# Patient Record
Sex: Male | Born: 1975 | Race: White | Hispanic: No
Health system: Western US, Academic
[De-identification: ages and names within clinical notes are randomized; demographics above are authoritative.]

## PROBLEM LIST (undated history)

## (undated) DIAGNOSIS — G43109 Migraine with aura, not intractable, without status migrainosus: Secondary | ICD-10-CM

## (undated) DIAGNOSIS — G4733 Obstructive sleep apnea (adult) (pediatric): Secondary | ICD-10-CM

## (undated) DIAGNOSIS — Q204 Double inlet ventricle: Secondary | ICD-10-CM

## (undated) DIAGNOSIS — D689 Coagulation defect, unspecified: Secondary | ICD-10-CM

## (undated) DIAGNOSIS — I4892 Unspecified atrial flutter: Secondary | ICD-10-CM

## (undated) DIAGNOSIS — M109 Gout, unspecified: Secondary | ICD-10-CM

## (undated) DIAGNOSIS — N12 Tubulo-interstitial nephritis, not specified as acute or chronic: Secondary | ICD-10-CM

## (undated) DIAGNOSIS — I495 Sick sinus syndrome: Secondary | ICD-10-CM

## (undated) HISTORY — DX: Coagulation defect, unspecified: D68.9

## (undated) HISTORY — DX: Gout, unspecified: M10.9

## (undated) HISTORY — DX: Sick sinus syndrome: I49.5

## (undated) HISTORY — DX: Migraine with aura, not intractable, without status migrainosus: G43.109

## (undated) HISTORY — DX: Unspecified atrial flutter: I48.92

## (undated) HISTORY — DX: Tubulo-interstitial nephritis, not specified as acute or chronic: N12

## (undated) HISTORY — DX: Obstructive sleep apnea (adult) (pediatric): G47.33

## (undated) HISTORY — DX: Double inlet ventricle: Q20.4

---

## 1998-05-07 ENCOUNTER — Other Ambulatory Visit (HOSPITAL_BASED_OUTPATIENT_CLINIC_OR_DEPARTMENT_OTHER): Payer: Self-pay | Admitting: Orthopaedic Surgery

## 1998-05-23 LAB — PATHOLOGY, SURGICAL

## 2001-03-21 ENCOUNTER — Encounter: Payer: Self-pay | Admitting: Cardiovascular Disease

## 2001-04-18 ENCOUNTER — Encounter: Payer: Self-pay | Admitting: Cardiovascular Disease

## 2001-04-18 ENCOUNTER — Other Ambulatory Visit: Payer: Self-pay

## 2014-09-13 DIAGNOSIS — S329XXA Fracture of unspecified parts of lumbosacral spine and pelvis, initial encounter for closed fracture: Secondary | ICD-10-CM

## 2014-09-13 DIAGNOSIS — G43909 Migraine, unspecified, not intractable, without status migrainosus: Secondary | ICD-10-CM

## 2014-09-13 DIAGNOSIS — F419 Anxiety disorder, unspecified: Secondary | ICD-10-CM

## 2014-09-13 DIAGNOSIS — I272 Pulmonary hypertension, unspecified: Secondary | ICD-10-CM

## 2014-09-13 DIAGNOSIS — D696 Thrombocytopenia, unspecified: Secondary | ICD-10-CM

## 2014-09-13 DIAGNOSIS — G4700 Insomnia, unspecified: Secondary | ICD-10-CM

## 2014-09-13 DIAGNOSIS — N200 Calculus of kidney: Secondary | ICD-10-CM

## 2014-09-13 DIAGNOSIS — M1990 Unspecified osteoarthritis, unspecified site: Secondary | ICD-10-CM

## 2014-09-13 HISTORY — DX: Insomnia, unspecified: G47.00

## 2014-09-13 HISTORY — DX: Unspecified osteoarthritis, unspecified site: M19.90

## 2014-09-13 HISTORY — DX: Anxiety disorder, unspecified: F41.9

## 2014-09-13 HISTORY — DX: Calculus of kidney: N20.0

## 2014-09-13 HISTORY — DX: Thrombocytopenia, unspecified: D69.6

## 2014-09-13 HISTORY — DX: Fracture of unspecified parts of lumbosacral spine and pelvis, initial encounter for closed fracture: S32.9XXA

## 2014-09-13 HISTORY — DX: Migraine, unspecified, not intractable, without status migrainosus: G43.909

## 2014-09-13 HISTORY — DX: Pulmonary hypertension, unspecified: I27.20

## 2014-10-03 ENCOUNTER — Encounter (HOSPITAL_BASED_OUTPATIENT_CLINIC_OR_DEPARTMENT_OTHER): Payer: Self-pay | Admitting: Cardiovascular Disease

## 2014-10-03 ENCOUNTER — Other Ambulatory Visit (HOSPITAL_BASED_OUTPATIENT_CLINIC_OR_DEPARTMENT_OTHER): Payer: Self-pay | Admitting: Cardiovascular Disease

## 2014-10-03 DIAGNOSIS — Q204 Double inlet ventricle: Secondary | ICD-10-CM

## 2014-10-03 NOTE — Progress Notes (Signed)
39 yo man with double inlet LV, severe pulmonary stenosis, dextroversion and L-TGA.  Right aortopulmonary shunt (BTT in one note) in [place] at age 39 years, then atriopulmonary Fontan in [place] at age 89 in [id number] at the time of Fontan was 15-17 and the CS was included on the left side of the atrial patch.  Then moved to [place] where altitude caused symptoms, so subsequently [place], [facility], then by Dr. Ernestene MentionGarabedian in [place].  In 2008 had epicardial dual chamber pacing system placed. Has Has HF, recent exacerbation requiring milrinone and lasix, cath scanned into Epic but shows VP 83/6/14, LPA and RPA PCWP 24, IVC 26 (though subsequent state shows VEDP of 13/21??).  Called diastolic dysfunction, referred for transplant.  PRAs 99%, liver with cirrhotic appearance, hemodynamics not yet done but no varices on EGD.    Have ordered echo and CPET for our evaluation here, but may need repeat cath to reassess hemodynamics with consideration for pulmonary vasodilator.  Needs visit with [facility] and Dr. Reather ConverseStempien-Otero

## 2014-10-14 DIAGNOSIS — Z01818 Encounter for other preprocedural examination: Secondary | ICD-10-CM

## 2014-10-15 ENCOUNTER — Ambulatory Visit (HOSPITAL_BASED_OUTPATIENT_CLINIC_OR_DEPARTMENT_OTHER): Payer: Medicare Other

## 2014-10-15 ENCOUNTER — Ambulatory Visit (HOSPITAL_BASED_OUTPATIENT_CLINIC_OR_DEPARTMENT_OTHER): Payer: Medicare Other | Admitting: Cardiovascular Disease

## 2014-10-15 ENCOUNTER — Encounter (HOSPITAL_BASED_OUTPATIENT_CLINIC_OR_DEPARTMENT_OTHER): Payer: Self-pay | Admitting: Cardiovascular Disease

## 2014-10-15 ENCOUNTER — Other Ambulatory Visit (HOSPITAL_COMMUNITY): Payer: Self-pay

## 2014-10-15 ENCOUNTER — Ambulatory Visit (HOSPITAL_BASED_OUTPATIENT_CLINIC_OR_DEPARTMENT_OTHER): Payer: Medicare Other | Admitting: Clinical

## 2014-10-15 ENCOUNTER — Ambulatory Visit: Payer: Medicare Other | Attending: Cardiovascular Disease

## 2014-10-15 VITALS — BP 99/59 | HR 84 | Ht 72.0 in | Wt 219.0 lb

## 2014-10-15 VITALS — BP 99/59 | HR 84 | Temp 99.0°F | Ht 72.0 in | Wt 219.0 lb

## 2014-10-15 DIAGNOSIS — Q210 Ventricular septal defect: Secondary | ICD-10-CM

## 2014-10-15 DIAGNOSIS — Q204 Double inlet ventricle: Secondary | ICD-10-CM

## 2014-10-15 DIAGNOSIS — R5383 Other fatigue: Secondary | ICD-10-CM | POA: Insufficient documentation

## 2014-10-15 DIAGNOSIS — I498 Other specified cardiac arrhythmias: Secondary | ICD-10-CM

## 2014-10-15 DIAGNOSIS — I495 Sick sinus syndrome: Secondary | ICD-10-CM | POA: Insufficient documentation

## 2014-10-15 DIAGNOSIS — I272 Other secondary pulmonary hypertension: Secondary | ICD-10-CM

## 2014-10-15 DIAGNOSIS — M2550 Pain in unspecified joint: Secondary | ICD-10-CM

## 2014-10-15 DIAGNOSIS — D696 Thrombocytopenia, unspecified: Secondary | ICD-10-CM | POA: Insufficient documentation

## 2014-10-15 DIAGNOSIS — Q203 Discordant ventriculoarterial connection: Secondary | ICD-10-CM | POA: Insufficient documentation

## 2014-10-15 DIAGNOSIS — I471 Supraventricular tachycardia: Secondary | ICD-10-CM

## 2014-10-15 DIAGNOSIS — I5043 Acute on chronic combined systolic (congestive) and diastolic (congestive) heart failure: Secondary | ICD-10-CM | POA: Insufficient documentation

## 2014-10-15 DIAGNOSIS — M25561 Pain in right knee: Secondary | ICD-10-CM | POA: Insufficient documentation

## 2014-10-15 DIAGNOSIS — Z01818 Encounter for other preprocedural examination: Secondary | ICD-10-CM

## 2014-10-15 DIAGNOSIS — M25562 Pain in left knee: Secondary | ICD-10-CM | POA: Insufficient documentation

## 2014-10-15 DIAGNOSIS — G8929 Other chronic pain: Secondary | ICD-10-CM

## 2014-10-15 DIAGNOSIS — R7989 Other specified abnormal findings of blood chemistry: Secondary | ICD-10-CM | POA: Insufficient documentation

## 2014-10-15 DIAGNOSIS — R5381 Other malaise: Secondary | ICD-10-CM

## 2014-10-15 DIAGNOSIS — M1990 Unspecified osteoarthritis, unspecified site: Secondary | ICD-10-CM | POA: Insufficient documentation

## 2014-10-15 DIAGNOSIS — R7982 Elevated C-reactive protein (CRP): Secondary | ICD-10-CM

## 2014-10-15 LAB — URINALYSIS COMPLETE, URN
Bacteria, URN: NONE SEEN
Bilirubin (Qual), URN: NEGATIVE
Epith Cells_Renal/Trans,URN: NEGATIVE /HPF
Epith Cells_Squamous, URN: NEGATIVE /LPF
Glucose Qual, URN: NEGATIVE mg/dL
Ketones, URN: NEGATIVE mg/dL
Leukocyte Esterase, URN: NEGATIVE
Nitrite, URN: NEGATIVE
Occult Blood, URN: NEGATIVE
Protein (Alb Semiquant), URN: NEGATIVE mg/dL
RBC, URN: NEGATIVE /HPF
Specific Gravity, URN: 1.014 g/mL (ref 1.002–1.027)
WBC, URN: NEGATIVE /HPF
pH, URN: 7 (ref 5.0–8.0)

## 2014-10-15 LAB — CBC, DIFF
% Basophils: 1 %
% Eosinophils: 3 %
% Immature Granulocytes: 0 %
% Lymphocytes: 20 %
% Monocytes: 12 %
% Neutrophils: 64 %
Absolute Eosinophil Count: 0.18 10*3/uL (ref 0.00–0.50)
Absolute Lymphocyte Count: 1.23 10*3/uL (ref 1.00–4.80)
Basophils: 0.03 10*3/uL (ref 0.00–0.20)
Hematocrit: 43 % (ref 38–50)
Hemoglobin: 13.5 g/dL (ref 13.0–18.0)
Immature Granulocytes: 0.02 10*3/uL (ref 0.00–0.05)
MCH: 25.6 pg — ABNORMAL LOW (ref 27.3–33.6)
MCHC: 31.7 g/dL — ABNORMAL LOW (ref 32.2–36.5)
MCV: 81 fL (ref 81–98)
Monocytes: 0.69 10*3/uL (ref 0.00–0.80)
Neutrophils: 3.87 10*3/uL (ref 1.80–7.00)
Platelet Count: 93 10*3/uL — ABNORMAL LOW (ref 150–400)
RBC: 5.27 mil/uL (ref 4.40–5.60)
RDW-CV: 21.2 % — ABNORMAL HIGH (ref 11.6–14.4)
WBC: 6.02 10*3/uL (ref 4.3–10.0)

## 2014-10-15 LAB — LIPID PANEL
Cholesterol (LDL): 51 mg/dL (ref <130)
Cholesterol/HDL Ratio: 2.3
HDL Cholesterol: 54 mg/dL (ref >39)
Non-HDL Cholesterol: 72 mg/dL (ref 0–159)
Total Cholesterol: 126 mg/dL (ref <200)
Triglyceride: 105 mg/dL (ref <150)

## 2014-10-15 LAB — MAGNESIUM: Magnesium: 2.4 mg/dL (ref 1.8–2.4)

## 2014-10-15 LAB — STANDARD DRUG SCREEN, URN
Acetaminophen Qualitative, URN: POSITIVE — AB
Alcohol (Ethyl), URN: NEGATIVE mg/dL
Amphet/Methamphetamine Qual,URN: NEGATIVE
Barbiturate (Qual), URN: NEGATIVE
Benzodiazepines (Qual), URN: NEGATIVE
Cannabinoids (Qual), URN: NEGATIVE
Cocaine (Qual), URN: NEGATIVE
Methadone (Qual), URN: NEGATIVE
Opiates (Qual), URN: POSITIVE — AB
Phencyclidine (Qual), URN: NEGATIVE
Tricyclic Antidepressants, URN: NEGATIVE

## 2014-10-15 LAB — PROTEIN/CREATININE RATIO, TIMED URINE
Creatinine/Unit, Urine: 160 mg/dL
Protein (Total), Urine: 12 mg/dL
Protein/Creatinine Ratio: <0.1 (ref <0.2)

## 2014-10-15 LAB — CK, CREATINE KINASE, TOTAL ACTIVITY: Creatine Kinase Total Activity: 841 U/L — ABNORMAL HIGH (ref 62–325)

## 2014-10-15 LAB — URIC ACID, SERUM: Uric Acid: 2.5 mg/dL — ABNORMAL LOW (ref 3.9–7.6)

## 2014-10-15 LAB — COMPREHENSIVE METABOLIC PANEL
ALT (GPT): 18 U/L (ref 10–64)
AST (GOT): 30 U/L (ref 9–38)
Albumin: 3.8 g/dL (ref 3.5–5.2)
Alkaline Phosphatase (Total): 126 U/L — ABNORMAL HIGH (ref 36–122)
Anion Gap: 7 (ref 4–12)
Bilirubin (Total): 0.9 mg/dL (ref 0.2–1.3)
Calcium: 8.8 mg/dL — ABNORMAL LOW (ref 8.9–10.2)
Carbon Dioxide, Total: 25 mEq/L (ref 22–32)
Chloride: 102 mEq/L (ref 98–108)
Creatinine: 1.31 mg/dL — ABNORMAL HIGH (ref 0.51–1.18)
GFR, Calc, African American: >60 mL/min (ref >59)
GFR, Calc, European American: >60 mL/min (ref >59)
Glucose: 89 mg/dL (ref 62–125)
Potassium: 4 mEq/L (ref 3.6–5.2)
Protein (Total): 6.3 g/dL (ref 6.0–8.2)
Sodium: 134 mEq/L — ABNORMAL LOW (ref 135–145)
Urea Nitrogen: 26 mg/dL — ABNORMAL HIGH (ref 8–21)

## 2014-10-15 LAB — IRON BINDING CAPACITY (W/IRON, TRANSFERRIN & TRANSF SAT)
Iron, SRM: 39 ug/dL (ref 31–171)
Total Iron Binding Capacity: 416 ug/dL (ref 250–460)
Transferrin Saturation: 9 % — ABNORMAL LOW (ref 15–50)
Transferrin: 297 mg/dL (ref 180–329)

## 2014-10-15 LAB — AMYLASE: Amylase Total: 24 U/L — ABNORMAL LOW (ref 27–106)

## 2014-10-15 LAB — B_TYPE NATRIURETIC PEPTIDE: B_Type Natriuretic Peptide: 71 pg/mL (ref <101)

## 2014-10-15 LAB — SED RATE: Erythrocyte Sedimentation Rate: 6 mm/hr (ref 0–15)

## 2014-10-15 LAB — ALCOHOL (ETHYL), URINE: Alcohol (Ethyl), URN: NEGATIVE mg/dL

## 2014-10-15 LAB — RHEUMATOID FACTOR: Rheumatoid Factor: <10 IU/mL (ref <15)

## 2014-10-15 LAB — C_REACTIVE PROTEIN: C_Reactive Protein: 63.2 mg/L — ABNORMAL HIGH (ref 0.0–10.0)

## 2014-10-15 LAB — GAMMA GLUTAMYL TRANSFERASE: Gamma Glutamyl Transferase: 103 U/L — ABNORMAL HIGH (ref 0–55)

## 2014-10-15 LAB — TRANSTHYRETIN (PRE-ALBUMIN): Transthyretin (Pre_Albumin): 14.4 mg/dL — ABNORMAL LOW (ref 19.0–45.0)

## 2014-10-15 LAB — THYROID STIMULATING HORMONE: Thyroid Stimulating Hormone: 5.767 u[IU]/mL — ABNORMAL HIGH (ref 0.400–5.000)

## 2014-10-15 LAB — FERRITIN: Ferritin: 59 ng/mL (ref 20–230)
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
## 2014-10-15 NOTE — Patient Instructions (Signed)
Thank you for coming in to see us today!    Plan:  -We will set up a cardiac cath to re-evaluate hemodynamics Marcello Moores(Tom Jones, MD)  -We will refer to rheumatology for joint pain and elevated CRP, we will try to arrange this for when you come back for cath  -We may have you see the EP specialists for your pacemaker  -No medications changes for now    Follow-up:  -To be determined, first follow-up will be cath visit

## 2014-10-16 LAB — HEPATITIS A,B,& C PANEL
Hepatitis A Ab: REACTIVE
Hepatitis B Core Ab: NONREACTIVE
Hepatitis B Surf Antibody Intl Units: 1.64 IU
Hepatitis B Surface Ab: NONREACTIVE
Hepatitis B Surface Antigen w/Reflex: NONREACTIVE
Hepatitis C Antibody w/Rflx PCR: NONREACTIVE

## 2014-10-16 LAB — PROTEIN ELECTR. REFLX PNL
Albumin: 3.8 g/dL (ref 3.5–4.9)
Alpha 1: 0.3 g/dL (ref 0.1–0.3)
Alpha 2: 0.6 g/dL (ref 0.3–0.8)
Beta: 0.7 g/dL (ref 0.6–1.0)
Electrophoresis Interp:: NORMAL
Gamma: 0.6 g/dL (ref 0.4–1.4)
Protein (Total): 6 g/dL (ref 6.0–8.2)

## 2014-10-16 LAB — SEROLOGIC SYPHILIS PANEL, SRM
Syphilis Igg Ab Result: NEGATIVE
Syphilis Screening Status: NEGATIVE

## 2014-10-16 LAB — ANTI TOXOPLASMA, IGG
Anti Toxoplasma, IgG Interp: NONREACTIVE
Anti Toxoplasma, IgG Result: <3.2 IU/mL (ref 0.0–7.4)

## 2014-10-16 LAB — EBV SEROLOGY
EBV IgG Antibody: POSITIVE
EBV IgM Antibody: NEGATIVE
EBV Nuclear Antibody: POSITIVE

## 2014-10-16 LAB — ANTI NUCLEAR ANTIBODIES: Antibodies to Nuclear Ags by IF (ANA): NEGATIVE

## 2014-10-16 LAB — HIV ANTIGEN AND ANTIBODY SCRN
HIV Antigen and Antibody Interpretation: NONREACTIVE
HIV Antigen and Antibody Result: NONREACTIVE

## 2014-10-16 LAB — PROTEIN ELECTROPHORESIS , URN

## 2014-10-16 LAB — ALCOHOL (ETHYL): Alcohol (Ethyl): NEGATIVE mg/dL

## 2014-10-16 LAB — LAB ADD ON ORDER

## 2014-10-16 LAB — VZV IMMUNE STATUS BY IFA: Varicella Zoster Immune Status Result: >1:8 {titer}

## 2014-10-16 LAB — HEMOGLOBIN A1C, HPLC: Hemoglobin A1C: 5.4 % (ref 4.0–6.0)

## 2014-10-17 DIAGNOSIS — Z87898 Personal history of other specified conditions: Secondary | ICD-10-CM | POA: Insufficient documentation

## 2014-10-17 DIAGNOSIS — Z8619 Personal history of other infectious and parasitic diseases: Secondary | ICD-10-CM | POA: Insufficient documentation

## 2014-10-17 DIAGNOSIS — Q204 Double inlet ventricle: Secondary | ICD-10-CM | POA: Insufficient documentation

## 2014-10-17 DIAGNOSIS — G43909 Migraine, unspecified, not intractable, without status migrainosus: Secondary | ICD-10-CM | POA: Insufficient documentation

## 2014-10-17 DIAGNOSIS — Z973 Presence of spectacles and contact lenses: Secondary | ICD-10-CM | POA: Insufficient documentation

## 2014-10-17 DIAGNOSIS — N200 Calculus of kidney: Secondary | ICD-10-CM | POA: Insufficient documentation

## 2014-10-17 DIAGNOSIS — S3994XA Unspecified injury of external genitals, initial encounter: Secondary | ICD-10-CM | POA: Insufficient documentation

## 2014-10-17 DIAGNOSIS — M1990 Unspecified osteoarthritis, unspecified site: Secondary | ICD-10-CM | POA: Insufficient documentation

## 2014-10-17 DIAGNOSIS — K7689 Other specified diseases of liver: Secondary | ICD-10-CM | POA: Insufficient documentation

## 2014-10-17 DIAGNOSIS — G43109 Migraine with aura, not intractable, without status migrainosus: Secondary | ICD-10-CM | POA: Insufficient documentation

## 2014-10-17 DIAGNOSIS — N12 Tubulo-interstitial nephritis, not specified as acute or chronic: Secondary | ICD-10-CM | POA: Insufficient documentation

## 2014-10-17 DIAGNOSIS — S62101A Fracture of unspecified carpal bone, right wrist, initial encounter for closed fracture: Secondary | ICD-10-CM | POA: Insufficient documentation

## 2014-10-17 DIAGNOSIS — D689 Coagulation defect, unspecified: Secondary | ICD-10-CM | POA: Insufficient documentation

## 2014-10-17 DIAGNOSIS — I272 Pulmonary hypertension, unspecified: Secondary | ICD-10-CM | POA: Insufficient documentation

## 2014-10-17 DIAGNOSIS — Z87448 Personal history of other diseases of urinary system: Secondary | ICD-10-CM | POA: Insufficient documentation

## 2014-10-17 DIAGNOSIS — I471 Supraventricular tachycardia: Secondary | ICD-10-CM | POA: Insufficient documentation

## 2014-10-17 DIAGNOSIS — D696 Thrombocytopenia, unspecified: Secondary | ICD-10-CM | POA: Insufficient documentation

## 2014-10-17 DIAGNOSIS — Z9889 Other specified postprocedural states: Secondary | ICD-10-CM | POA: Insufficient documentation

## 2014-10-17 DIAGNOSIS — I10 Essential (primary) hypertension: Secondary | ICD-10-CM | POA: Insufficient documentation

## 2014-10-17 DIAGNOSIS — F419 Anxiety disorder, unspecified: Secondary | ICD-10-CM | POA: Insufficient documentation

## 2014-10-17 DIAGNOSIS — G4700 Insomnia, unspecified: Secondary | ICD-10-CM | POA: Insufficient documentation

## 2014-10-17 DIAGNOSIS — I495 Sick sinus syndrome: Secondary | ICD-10-CM | POA: Insufficient documentation

## 2014-10-17 DIAGNOSIS — M109 Gout, unspecified: Secondary | ICD-10-CM | POA: Insufficient documentation

## 2014-10-17 DIAGNOSIS — S329XXA Fracture of unspecified parts of lumbosacral spine and pelvis, initial encounter for closed fracture: Secondary | ICD-10-CM | POA: Insufficient documentation

## 2014-10-17 LAB — CMV IMMUNE STATUS BY EIA: CMV Immune Status Result: NEGATIVE

## 2014-10-17 LAB — RUBEOLA IMMUNE STATUS BY EIA: Rubeola Immune Status Result: POSITIVE

## 2014-10-17 LAB — MUMPS IMMUNE STATUS BY EIA: Mumps Immune Status Result: POSITIVE

## 2014-10-17 LAB — QUANTIFERON TB TEST
Quantiferon TB Ag Result: 0.051 IU gamma IF/mL
Quantiferon TB MIT Result: 0.945 IU gamma IF/mL
Quantiferon TB Nil Result: 0.046 IU gamma IF/mL

## 2014-10-17 LAB — RUBELLA IMMUNE STATUS BY EIA
Rubella Antibody Level: >20 IU
Rubella Immune Status Result: POSITIVE

## 2014-10-18 DIAGNOSIS — G4733 Obstructive sleep apnea (adult) (pediatric): Secondary | ICD-10-CM | POA: Insufficient documentation

## 2014-10-18 DIAGNOSIS — I4892 Unspecified atrial flutter: Secondary | ICD-10-CM | POA: Insufficient documentation

## 2014-10-18 DIAGNOSIS — I509 Heart failure, unspecified: Secondary | ICD-10-CM | POA: Insufficient documentation

## 2014-10-18 LAB — ABO, RH (SENDOUT)

## 2014-10-18 LAB — HERPES TYPE 1 & 2 SEROLOGY
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
## 2014-10-21 ENCOUNTER — Other Ambulatory Visit (HOSPITAL_BASED_OUTPATIENT_CLINIC_OR_DEPARTMENT_OTHER): Payer: Self-pay | Admitting: Internal Medicine

## 2014-10-21 DIAGNOSIS — Z9889 Other specified postprocedural states: Secondary | ICD-10-CM

## 2014-10-21 DIAGNOSIS — K7469 Other cirrhosis of liver: Secondary | ICD-10-CM

## 2014-10-21 DIAGNOSIS — Q204 Double inlet ventricle: Secondary | ICD-10-CM

## 2014-10-21 DIAGNOSIS — I509 Heart failure, unspecified: Secondary | ICD-10-CM

## 2014-10-21 DIAGNOSIS — I495 Sick sinus syndrome: Secondary | ICD-10-CM

## 2014-10-21 NOTE — Progress Notes (Signed)
I saw and evaluated the patient and agree with the documentation, including findings, assessment and plan as described in Dr. Habib's note.

## 2014-10-23 ENCOUNTER — Encounter (HOSPITAL_BASED_OUTPATIENT_CLINIC_OR_DEPARTMENT_OTHER): Payer: Self-pay | Admitting: Adult Congenital Heart Disease

## 2014-10-23 ENCOUNTER — Encounter (HOSPITAL_BASED_OUTPATIENT_CLINIC_OR_DEPARTMENT_OTHER): Payer: Medicare Other | Admitting: Cardiovascular Disease

## 2014-10-23 DIAGNOSIS — Q204 Double inlet ventricle: Secondary | ICD-10-CM

## 2014-10-23 NOTE — Progress Notes (Deleted)
Patient Referred By: No ref. provider found  Patient's PCP: Buren KosLin, Wei-Hsung     Subjective:  Patient is a 39 year old male, here to discuss Other    {Common ambulatory SmartLinks:19316}    HPI    Review of Systems      Objective:  Physical Exam     Assessment and Plan:   {Assess/PlanSmartLinks:21044}

## 2014-10-23 NOTE — Progress Notes (Signed)
Per Dr. Fran LowesKrieger, I have faxed all of Lucas Bass's records to Dr[facility] Ernest[facility] [place]abedian [facility] [phone].

## 2014-10-25 LAB — HLA CLASS II DR,DQ TYPING (SENDOUT)

## 2014-10-25 LAB — HLA AB SPECIFICITY (SENDOUT)

## 2014-10-25 LAB — HLA AB DETECTION (SENDOUT)

## 2014-10-25 LAB — PSBC-HLA CLASS A&B MOLEC TYP.

## 2014-11-01 ENCOUNTER — Telehealth (HOSPITAL_BASED_OUTPATIENT_CLINIC_OR_DEPARTMENT_OTHER): Payer: Self-pay | Admitting: Cardiovascular Disease

## 2014-11-01 ENCOUNTER — Telehealth (HOSPITAL_BASED_OUTPATIENT_CLINIC_OR_DEPARTMENT_OTHER): Payer: Self-pay

## 2014-11-01 NOTE — Telephone Encounter (Signed)
I left detailed VM as Sheralyn Boatmanoni or Ephriam KnucklesChristian to call back re: finalizing plans for Kshawn's procedures on 2/25.

## 2014-11-01 NOTE — Telephone Encounter (Signed)
Faxed [facility] orders for holding for cath on 2/25 to Dr. Michaelyn BarterH. Garabedian's office [phone] who manages his INR.  Karolee StampsAmanda K Meier, RN

## 2014-11-05 NOTE — Telephone Encounter (Addendum)
I spoke w/ Ephriam KnucklesChristian and confirmed all appts for this week:    2/24 pre anesthesia  2/25 10:00a appt w/ Dr. Suella BroadSeslar, 11:30a check in for cath & transjugular liver biopsy.  Ephriam KnucklesChristian said he'd call back with email address to email informational packet to.      Informational packet emailed to pt & wife.

## 2014-11-06 ENCOUNTER — Ambulatory Visit: Payer: Medicare Other

## 2014-11-07 ENCOUNTER — Ambulatory Visit (HOSPITAL_BASED_OUTPATIENT_CLINIC_OR_DEPARTMENT_OTHER): Payer: Medicare Other | Admitting: Pediatrics

## 2014-11-07 ENCOUNTER — Ambulatory Visit
Admission: RE | Admit: 2014-11-07 | Discharge: 2014-11-08 | Disposition: A | Discharge status: Home / Self Care | Payer: Medicare Other | Attending: Interventional Radiology | Admitting: Interventional Radiology

## 2014-11-07 ENCOUNTER — Other Ambulatory Visit: Payer: Self-pay | Admitting: Cardiovascular Disease

## 2014-11-07 ENCOUNTER — Encounter (HOSPITAL_BASED_OUTPATIENT_CLINIC_OR_DEPARTMENT_OTHER): Payer: Self-pay | Admitting: Pediatrics

## 2014-11-07 ENCOUNTER — Ambulatory Visit (HOSPITAL_BASED_OUTPATIENT_CLINIC_OR_DEPARTMENT_OTHER): Payer: Medicare Other

## 2014-11-07 ENCOUNTER — Ambulatory Visit (HOSPITAL_BASED_OUTPATIENT_CLINIC_OR_DEPARTMENT_OTHER): Payer: Medicare Other | Admitting: Interventional Radiology

## 2014-11-07 ENCOUNTER — Other Ambulatory Visit (HOSPITAL_BASED_OUTPATIENT_CLINIC_OR_DEPARTMENT_OTHER): Payer: Self-pay | Admitting: Interventional Radiology

## 2014-11-07 VITALS — BP 113/64 | HR 78 | Temp 97.9°F | Ht 72.0 in | Wt 213.0 lb

## 2014-11-07 DIAGNOSIS — I4510 Unspecified right bundle-branch block: Secondary | ICD-10-CM

## 2014-11-07 DIAGNOSIS — K759 Inflammatory liver disease, unspecified: Secondary | ICD-10-CM

## 2014-11-07 DIAGNOSIS — K769 Liver disease, unspecified: Secondary | ICD-10-CM

## 2014-11-07 DIAGNOSIS — R945 Abnormal results of liver function studies: Secondary | ICD-10-CM | POA: Insufficient documentation

## 2014-11-07 DIAGNOSIS — Q204 Double inlet ventricle: Secondary | ICD-10-CM

## 2014-11-07 DIAGNOSIS — I509 Heart failure, unspecified: Secondary | ICD-10-CM | POA: Insufficient documentation

## 2014-11-07 DIAGNOSIS — I272 Other secondary pulmonary hypertension: Secondary | ICD-10-CM

## 2014-11-07 DIAGNOSIS — Z9889 Other specified postprocedural states: Secondary | ICD-10-CM | POA: Insufficient documentation

## 2014-11-07 DIAGNOSIS — Q256 Stenosis of pulmonary artery: Secondary | ICD-10-CM | POA: Insufficient documentation

## 2014-11-07 DIAGNOSIS — Q203 Discordant ventriculoarterial connection: Secondary | ICD-10-CM | POA: Insufficient documentation

## 2014-11-07 DIAGNOSIS — Z950 Presence of cardiac pacemaker: Secondary | ICD-10-CM | POA: Insufficient documentation

## 2014-11-07 DIAGNOSIS — I495 Sick sinus syndrome: Secondary | ICD-10-CM | POA: Insufficient documentation

## 2014-11-07 DIAGNOSIS — Z8774 Personal history of (corrected) congenital malformations of heart and circulatory system: Secondary | ICD-10-CM

## 2014-11-07 LAB — BASIC METABOLIC PANEL
Anion Gap: 8 (ref 4–12)
Calcium: 9.6 mg/dL (ref 8.9–10.2)
Carbon Dioxide, Total: 26 mEq/L (ref 22–32)
Chloride: 103 mEq/L (ref 98–108)
Creatinine: 0.99 mg/dL (ref 0.51–1.18)
GFR, Calc, African American: >60 mL/min (ref >59)
GFR, Calc, European American: >60 mL/min (ref >59)
Glucose: 137 mg/dL — ABNORMAL HIGH (ref 62–125)
Potassium: 4.1 mEq/L (ref 3.6–5.2)
Sodium: 137 mEq/L (ref 135–145)
Urea Nitrogen: 20 mg/dL (ref 8–21)

## 2014-11-07 LAB — CBC (HEMOGRAM)
Hematocrit: 44 % (ref 38–50)
Hemoglobin: 14.3 g/dL (ref 13.0–18.0)
MCH: 26.4 pg — ABNORMAL LOW (ref 27.3–33.6)
MCHC: 32.5 g/dL (ref 32.2–36.5)
MCV: 81 fL (ref 81–98)
Platelet Count: 90 10*3/uL — ABNORMAL LOW (ref 150–400)
RBC: 5.42 mil/uL (ref 4.40–5.60)
RDW-CV: 18.8 % — ABNORMAL HIGH (ref 11.6–14.4)
WBC: 5.92 10*3/uL (ref 4.3–10.0)

## 2014-11-07 LAB — PROTHROMBIN TIME
Prothrombin INR: 1.4 — ABNORMAL HIGH (ref 0.8–1.3)
Prothrombin Time Patient: 16.9 s — ABNORMAL HIGH (ref 10.7–15.6)
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
## 2014-11-11 ENCOUNTER — Encounter (HOSPITAL_BASED_OUTPATIENT_CLINIC_OR_DEPARTMENT_OTHER): Payer: Self-pay

## 2014-11-11 NOTE — Progress Notes (Signed)
Faxed pt's Cardiac Cath report from 11/07/14 to French Anaracy, RN for Dr. Ernestene MentionGarabedian to [phone]). Debi Boling

## 2014-11-12 LAB — PATHOLOGY, SURGICAL

## 2014-11-29 ENCOUNTER — Encounter (HOSPITAL_BASED_OUTPATIENT_CLINIC_OR_DEPARTMENT_OTHER): Payer: Self-pay | Admitting: Cardiovascular Disease

## 2014-11-29 NOTE — Progress Notes (Signed)
I spoke with Lucas Bass regarding his cath results, which suggest volume overload that may be improved by an inpatient stay for IV diuresis.  He is amenable to scheduling this, and will call Marchelle FolksAmanda, our nurse, when he has some dates in mind.  We'd like to use that time to advance any other parts of the transplant evaluation, noting that the liver biopsy does not show cirrhosis.

## 2014-12-05 NOTE — Progress Notes (Signed)
I reviewed the OP note from [facility] and found that the ventricular pacing lead is a MyoPore Sutureless Bipolar Epicardial Pacing Lead ([id number]). Thus to do multisite pacing would require the placement of an additional pacing lead.

## 2014-12-16 ENCOUNTER — Encounter (HOSPITAL_BASED_OUTPATIENT_CLINIC_OR_DEPARTMENT_OTHER): Payer: Medicare Other | Admitting: Internal Medicine

## 2014-12-16 ENCOUNTER — Other Ambulatory Visit (HOSPITAL_BASED_OUTPATIENT_CLINIC_OR_DEPARTMENT_OTHER): Payer: Self-pay | Admitting: Cardiovascular Disease

## 2014-12-16 ENCOUNTER — Telehealth (HOSPITAL_BASED_OUTPATIENT_CLINIC_OR_DEPARTMENT_OTHER): Payer: Self-pay

## 2014-12-16 ENCOUNTER — Telehealth (HOSPITAL_BASED_OUTPATIENT_CLINIC_OR_DEPARTMENT_OTHER): Payer: Self-pay | Admitting: Cardiovascular Disease

## 2014-12-16 DIAGNOSIS — Z9889 Other specified postprocedural states: Secondary | ICD-10-CM

## 2014-12-16 DIAGNOSIS — M1990 Unspecified osteoarthritis, unspecified site: Secondary | ICD-10-CM

## 2014-12-16 DIAGNOSIS — I5042 Chronic combined systolic (congestive) and diastolic (congestive) heart failure: Secondary | ICD-10-CM

## 2014-12-16 DIAGNOSIS — Q204 Double inlet ventricle: Secondary | ICD-10-CM

## 2014-12-16 NOTE — Telephone Encounter (Signed)
I spoke with Ephriam KnucklesChristian to let him know the next steps for him.    1. Schedule an outpt appt with [facility].  Ephriam KnucklesChristian was originally scheduled to see them today but had elected to cancel.  Ephriam KnucklesChristian is aware Dr. Valentina LucksStout will put a new referral in including the information from his ortho workup [place] and that this referral will be entered today or tomorrow then [facility] will call him directly to schedule.    2. Once appt is scheduled we will see if Dr. Reather ConverseStempien Otero can see him around the [facility] appt for a follow up re: his initial appt with her.    3. After Ephriam KnucklesChristian completes his [facility] appt [facility] team will connect with [facility] re: next steps.  If the plan is for a knee procedure then [facility] will most likely plan a cardiac admission at the same time.

## 2014-12-16 NOTE — Telephone Encounter (Signed)
Called Dr. Michaelyn BarterH. Garabedian's RN to update her on plan of care for Lucas Bass and his knee replacement surgery.    Also called Lucas Bass to touch base about recent symptoms.  Lucas Bass said that several weeks ago he had an "episode" and was started on Bumex by Dr. Harland DingwallH. Garabedian.  He also states that he goes in to his local ED for IV Lasix dosing every 2-3 weeks, managed also by Dr. Harland DingwallH. Garabedian.    He states he is doing fine at this time and will let us know if he is not.  Karolee StampsAmanda K Meier, RN

## 2014-12-16 NOTE — Telephone Encounter (Signed)
Lucas Bass left a detailed message on my VM while I was away on vacation last week.  I called Lucas Bass back to inquire about what he needed assistance with.  Lucas Bass was unclear about next steps and cardiology follow up [facility] at [facility].  I informed [name], [facility] RN, and I would clarify with Dr Valentina LucksStout and get back to him.

## 2014-12-18 ENCOUNTER — Telehealth (HOSPITAL_BASED_OUTPATIENT_CLINIC_OR_DEPARTMENT_OTHER): Payer: Self-pay

## 2014-12-18 NOTE — Telephone Encounter (Signed)
Lucas Bass called to let Lucas Bass know that [facility] has [facility].  He has also let Dr. Jaquelyn Bass's team in [place] know as well.  Lucas StampsAmanda K Meier, RN

## 2014-12-23 ENCOUNTER — Telehealth (HOSPITAL_BASED_OUTPATIENT_CLINIC_OR_DEPARTMENT_OTHER): Payer: Self-pay

## 2014-12-23 NOTE — Telephone Encounter (Signed)
Called and stated that he is seeing the dentist and they are taking the EPI out of the Lidocaine mixture they use to numb his gums for his teeth procedure this week.  Dr. Garabedian prescribes his pre-med, but haErnestene Mentions never consulted on the EPI/Lidocaine mix for his gums, so he calls us today.    Stated okay for dentist to use this mixture for him, and if they have any further questions, it is okay for the dentist to call us or Dr. Ernestene MentionGarabedian with any questions or concerns.    States he is home from the hospital now and he is keeping his daily fluid intake to 2000 ml.    Karolee StampsAmanda K Meier, RN

## 2014-12-31 ENCOUNTER — Telehealth (HOSPITAL_BASED_OUTPATIENT_CLINIC_OR_DEPARTMENT_OTHER): Payer: Self-pay

## 2014-12-31 NOTE — Telephone Encounter (Signed)
Pt called to ask about his referral to ortho - Jillian is working on thisValli Glance.    Called to state he's taking a lot of pain meds.  Asked him to call his PCP, Dr. Juel BurrowLin, who prescribes his pain med.    Stated he titrate's his diuretic medication daily based on the color of his urine.      Stated his weight is down.      Reminded pt to call Dr. Jaquelyn BitterGarabedian's office, who is his primary cardiologist at this time, and is following him and managing his diuretic therapy.

## 2015-01-02 ENCOUNTER — Telehealth (HOSPITAL_BASED_OUTPATIENT_CLINIC_OR_DEPARTMENT_OTHER): Payer: Self-pay | Admitting: Orthopaedic Surgery

## 2015-01-02 NOTE — Telephone Encounter (Signed)
Dr. Deliah Goodyhansky's schedule is not available.  I will add pt to the recall list.  Lucas Bass 4/21

## 2015-01-02 NOTE — Telephone Encounter (Signed)
CONFIRMED PHONE NUMBER: [phone]  CALLERS FIRST AND LAST NAME: Margrett RudChristian Paul Pullin  FACILITY NAME: NA TITLE: NA  CALLERS RELATIONSHIP:Self  RETURN CALL: General message OK     ([facility] ONLY) Texting is an option for this clinic. If you would like us to use this option, which mobile phone number should we text to if we are unable to reach you?    SUBJECT: Appointment Request   REASON FOR REQUEST: Patient calling int to schedule from referral    REQUEST APPOINTMENT WITH: Dr. Benay Pillowhansky  SYMPTOMS: Arthritis ,single ventricle, S/P fontan procedure,Chronic combined systolic and diastolic heart failure   REFERRING PROVIDER: Dr. Nadene RubinsKaren Stout  REQUESTED DATE: asap  REQUESTED TIME: see patient   UNABLE TO APPOINT: Other: [facility] does not schedule for Dr. Carollee Massedhanksy per sop warm transfer or send TE

## 2015-01-30 ENCOUNTER — Telehealth (HOSPITAL_BASED_OUTPATIENT_CLINIC_OR_DEPARTMENT_OTHER): Payer: Self-pay | Admitting: Cardiovascular Disease

## 2015-01-30 NOTE — Telephone Encounter (Signed)
Lucas Bass was scheduled to come over on June 9th for a [facility] eval with the plan to also see Lucas Bass & Lucas Bass on the same day.  In speaking with Lucas Bass a couple of weeks ago to finalize appts for 6/9 Lucas Bass stated he had another appt scheduled for local follow up with Lucas Bass and requested to hold off on finalizing [place] appts for 6/9 until after he saw [name].  I called Lucas Bass today and he informed me he saw Lucas Bass this past week and is getting scheduled for surgery on his knees in the next few weeks locally.  Lucas Bass stated at this time he'd like to cancel the appts for June 9th and continue to follow up with Lucas Bass and the local Ortho providers.  Lucas Bass stated he or the [name]' will contact us as needed.  I let Lucas Bass know I'd pass this along to Lucas Bass & Lucas Bass and someone would be in touch if there were any questions or concerns.

## 2015-02-20 ENCOUNTER — Encounter (HOSPITAL_BASED_OUTPATIENT_CLINIC_OR_DEPARTMENT_OTHER): Payer: Medicare Other | Admitting: Orthopaedic Surgery

## 2015-07-03 IMAGING — CR FOREARM RT 2 VWS
1 series · 2 of 2 positions shown · non-contrast
Comparison: None.

HISTORY: Right forearm pain.
TECHNIQUE: Right forearm x-ray series 2 views.

[Series 1: ap · 0.17mm/px · 2 of 2 slices shown]
[im 1/2]
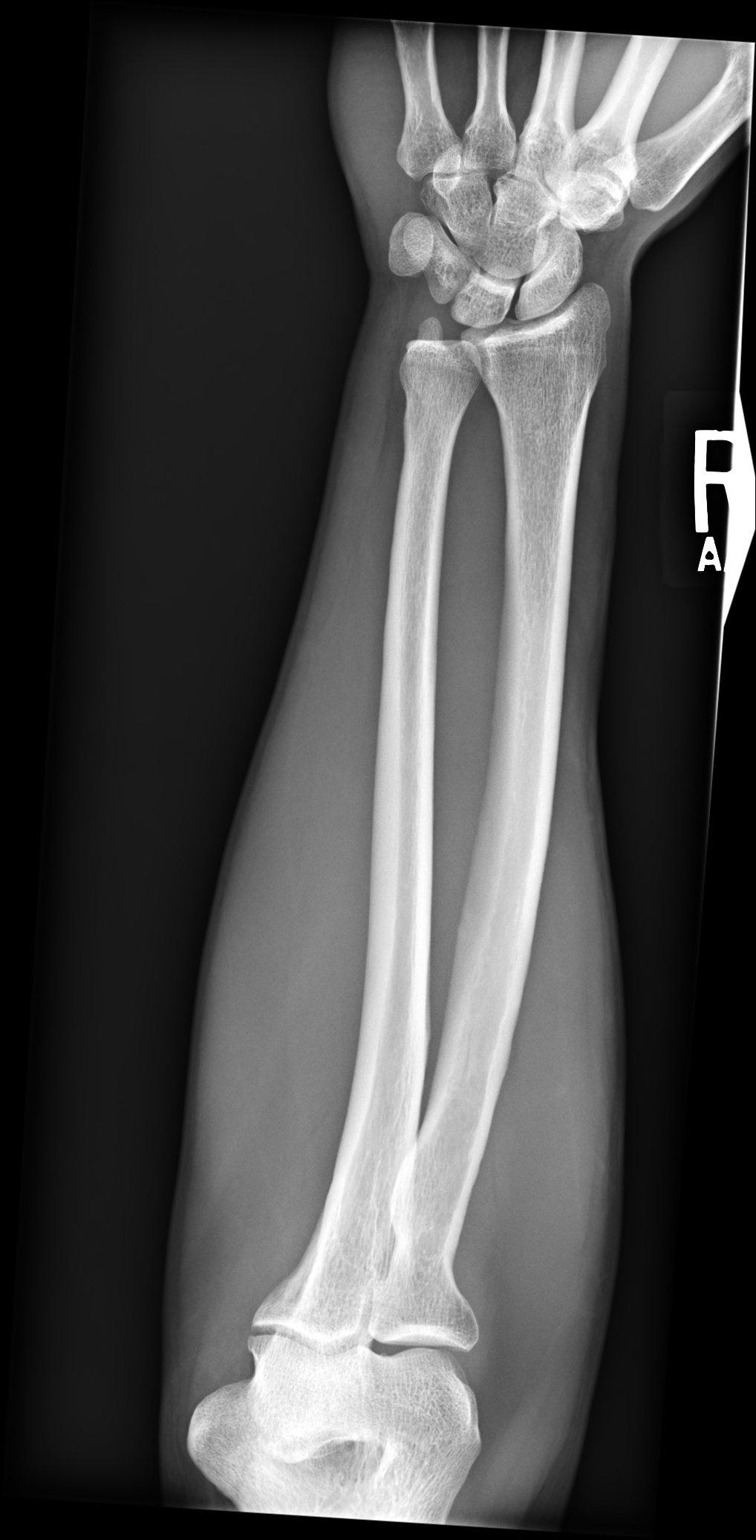
[im 2/2]
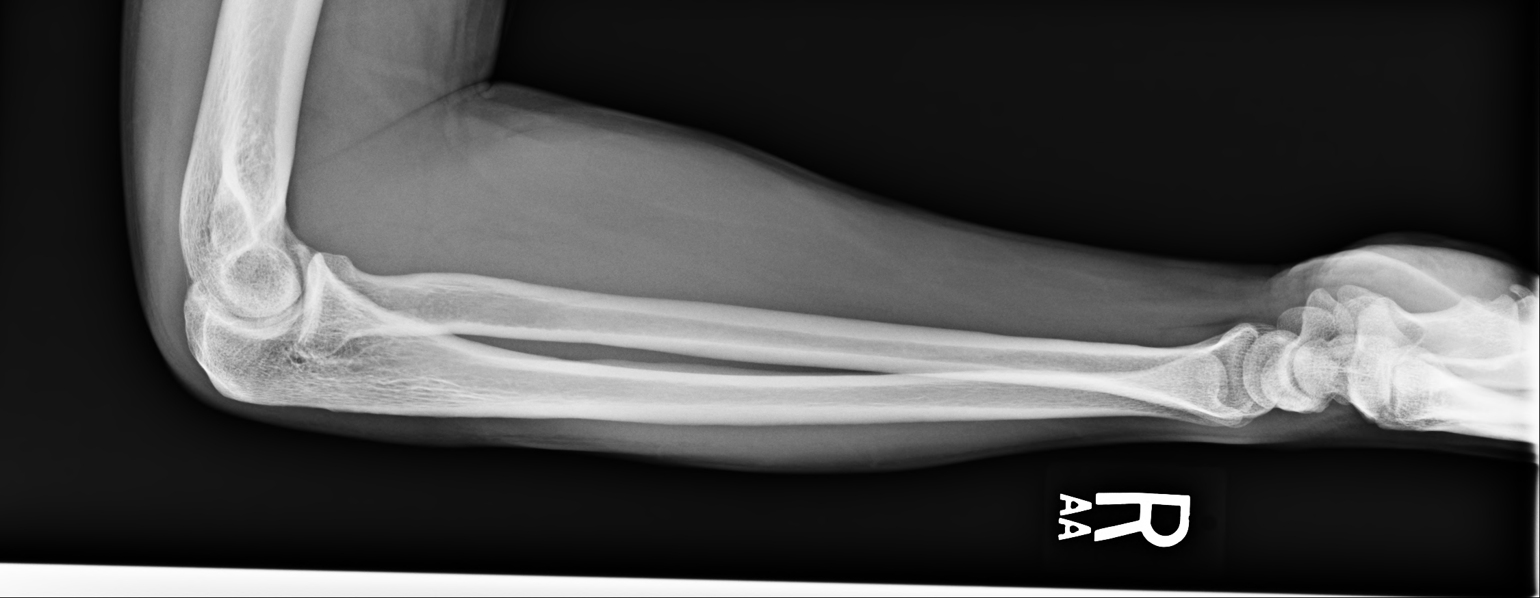

[2 of 2 positions shown; findings below may reference images not displayed]

FINDINGS: There is no evidence of fracture, periosteal new bone or destructive lesion. No radiopaque foreign body is seen.
IMPRESSION: Normal right forearm x-rays.

## 2017-09-20 ENCOUNTER — Telehealth (HOSPITAL_BASED_OUTPATIENT_CLINIC_OR_DEPARTMENT_OTHER): Payer: Self-pay

## 2017-09-20 NOTE — Telephone Encounter (Signed)
Received a fax, scanned into media, from Dr. Shawna ClampNicolarsen asking for the pt to be re-evaluated for heart vs heart liver transplant.      Per Dr. Valentina LucksStout regarding conference presentation: given that it'll have been over 2 years, likely reasonable to see him back unless travel is an issue, then represent after seeing him.  If travel is an issue can just present.    Called the pt numbers on file, no response.    Letter sent to the pt asking for a callback to discuss next steps.

## 2017-09-22 NOTE — Telephone Encounter (Addendum)
Spoke with the pt and his wife.  He is interested in being  presented again for transplant consideration.  When discussing a visit with the [facility] team, the pt deferred wishing to instead be presented first stating that the "pass in winter was too unpredictable to schedule a visit".    Advised that we will look through his records and would [facility]  We will be in touch if we have any questions or after we have presented him.    He verbalized understanding that he is in the que to be discussed but that we only present a few patients a month, so we will get back to him as soon as we can.    Providers notified with the pt's request for presentation.

## 2017-11-22 ENCOUNTER — Inpatient Hospital Stay: Payer: Self-pay

## 2017-12-01 ENCOUNTER — Encounter (HOSPITAL_BASED_OUTPATIENT_CLINIC_OR_DEPARTMENT_OTHER): Payer: Self-pay | Admitting: Internal Medicine
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
## 2017-12-15 ENCOUNTER — Telehealth (HOSPITAL_BASED_OUTPATIENT_CLINIC_OR_DEPARTMENT_OTHER): Payer: Self-pay

## 2017-12-15 NOTE — Telephone Encounter (Signed)
Spoke with the pt, advised that the [facility] team along with CTS and HF discussed the pt but are unable to give a definitive answer for transplant until he is evaluated by CTS, HF, [facility], and Hepatology.    He expressed thanks, will wait to hear from us.  He is aware these visits will be over a 2 day span.    Phone numbers in the pt's chart are current.

## 2017-12-15 NOTE — Telephone Encounter (Signed)
Providers updated, requested referrals to see Dr. Ashley Jacobseyes and Dr. Rulon EisenmengerVerrier, and CT orders.

## 2017-12-19 ENCOUNTER — Other Ambulatory Visit (HOSPITAL_BASED_OUTPATIENT_CLINIC_OR_DEPARTMENT_OTHER): Payer: Self-pay | Admitting: Cardiovascular Disease

## 2017-12-19 DIAGNOSIS — Q204 Double inlet ventricle: Secondary | ICD-10-CM

## 2017-12-19 DIAGNOSIS — K7689 Other specified diseases of liver: Secondary | ICD-10-CM

## 2017-12-19 DIAGNOSIS — I509 Heart failure, unspecified: Secondary | ICD-10-CM

## 2017-12-22 ENCOUNTER — Telehealth (HOSPITAL_BASED_OUTPATIENT_CLINIC_OR_DEPARTMENT_OTHER): Payer: Self-pay | Admitting: Cardiovascular Disease

## 2017-12-22 NOTE — Telephone Encounter (Signed)
(  TEXTING IS AN OPTION FOR [facility] CLINICS ONLY)  Is this a [facility] clinic? No      RETURN CALL: Detailed message on voicemail only      SUBJECT:  General Message     REASON FOR REQUEST: CT follow up    MESSAGE: Jasmine DecemberSharon from Radiology attempted to contact patient to schedule for CT scan, but patient was frustrated and did not want to schedule because he has not spoke with his care team regarding questions. Jasmine DecemberSharon says patient is concerned that the CT scan may not work well with his gallstones and liver cirrhosis. Radiology not sure how to follow up. Jasmine DecemberSharon unable to reach clinic and requested sending a te. Thank you.

## 2017-12-28 ENCOUNTER — Encounter (HOSPITAL_BASED_OUTPATIENT_CLINIC_OR_DEPARTMENT_OTHER): Payer: Self-pay | Admitting: Cardiovascular Disease

## 2017-12-28 DIAGNOSIS — K7689 Other specified diseases of liver: Secondary | ICD-10-CM

## 2017-12-28 DIAGNOSIS — Q204 Double inlet ventricle: Secondary | ICD-10-CM

## 2017-12-28 DIAGNOSIS — I509 Heart failure, unspecified: Secondary | ICD-10-CM

## 2018-01-12 ENCOUNTER — Telehealth (HOSPITAL_BASED_OUTPATIENT_CLINIC_OR_DEPARTMENT_OTHER): Payer: Self-pay

## 2018-01-12 ENCOUNTER — Encounter (HOSPITAL_BASED_OUTPATIENT_CLINIC_OR_DEPARTMENT_OTHER): Payer: Self-pay | Admitting: Cardiovascular Disease

## 2018-01-12 NOTE — Telephone Encounter (Signed)
Call received from pt requesting a letter to be completed for "[employer]" to explain to them that patient has appointments all day on 02/02/18 and he needs to stay overnight the day before (02/01/18) and the night of appointments (02/02/18) due to cardiac condition as pt has a long drive back to [facility] (4+ hour drive).    [employer] [facility] [employer] [phone], fax [phone].  Pt stated we can provide as much medical history as needed.  Pt states he has contrast to start taking early that am for CT.  Needed by next week.

## 2018-01-13 NOTE — Telephone Encounter (Addendum)
Faxed signed letter to [employer].  Confirmation rec'd.    ecare sent to pt with update.

## 2018-01-23 ENCOUNTER — Other Ambulatory Visit (HOSPITAL_BASED_OUTPATIENT_CLINIC_OR_DEPARTMENT_OTHER): Payer: Self-pay | Admitting: Cardiovascular Disease

## 2018-01-23 DIAGNOSIS — I495 Sick sinus syndrome: Secondary | ICD-10-CM

## 2018-01-23 DIAGNOSIS — Q204 Double inlet ventricle: Secondary | ICD-10-CM

## 2018-01-23 DIAGNOSIS — I471 Supraventricular tachycardia: Secondary | ICD-10-CM

## 2018-01-23 DIAGNOSIS — I509 Heart failure, unspecified: Secondary | ICD-10-CM

## 2018-02-01 ENCOUNTER — Encounter (HOSPITAL_BASED_OUTPATIENT_CLINIC_OR_DEPARTMENT_OTHER): Payer: Self-pay | Admitting: Registered Nurse

## 2018-02-01 ENCOUNTER — Other Ambulatory Visit
Admit: 2018-02-01 | Discharge: 2018-02-01 | Disposition: A | Discharge status: Home / Self Care | Payer: Medicare Other | Attending: Cardiovascular Disease | Admitting: Cardiovascular Disease

## 2018-02-01 DIAGNOSIS — Q204 Double inlet ventricle: Secondary | ICD-10-CM | POA: Insufficient documentation

## 2018-02-01 DIAGNOSIS — I509 Heart failure, unspecified: Secondary | ICD-10-CM | POA: Insufficient documentation

## 2018-02-01 DIAGNOSIS — I495 Sick sinus syndrome: Secondary | ICD-10-CM | POA: Insufficient documentation

## 2018-02-01 DIAGNOSIS — I498 Other specified cardiac arrhythmias: Secondary | ICD-10-CM | POA: Insufficient documentation

## 2018-02-02 ENCOUNTER — Ambulatory Visit: Payer: Medicare Other | Attending: Cardiovascular Disease | Admitting: Cardiovascular Disease

## 2018-02-02 ENCOUNTER — Ambulatory Visit (HOSPITAL_BASED_OUTPATIENT_CLINIC_OR_DEPARTMENT_OTHER): Payer: Medicare Other | Admitting: Cardiovascular Disease

## 2018-02-02 ENCOUNTER — Ambulatory Visit (HOSPITAL_BASED_OUTPATIENT_CLINIC_OR_DEPARTMENT_OTHER): Payer: Medicare Other

## 2018-02-02 ENCOUNTER — Encounter (HOSPITAL_BASED_OUTPATIENT_CLINIC_OR_DEPARTMENT_OTHER): Payer: Self-pay | Admitting: Cardiovascular Disease

## 2018-02-02 ENCOUNTER — Encounter (HOSPITAL_BASED_OUTPATIENT_CLINIC_OR_DEPARTMENT_OTHER): Payer: Self-pay | Admitting: Cardiology Med

## 2018-02-02 ENCOUNTER — Ambulatory Visit (HOSPITAL_BASED_OUTPATIENT_CLINIC_OR_DEPARTMENT_OTHER): Payer: Medicare Other | Admitting: Thoracic Surgery (Cardiothoracic Vascular Surgery)

## 2018-02-02 ENCOUNTER — Ambulatory Visit (HOSPITAL_BASED_OUTPATIENT_CLINIC_OR_DEPARTMENT_OTHER): Payer: Medicare Other | Admitting: Transplant Surgery

## 2018-02-02 ENCOUNTER — Encounter (HOSPITAL_BASED_OUTPATIENT_CLINIC_OR_DEPARTMENT_OTHER): Payer: Self-pay | Admitting: Transplant Surgery

## 2018-02-02 VITALS — BP 104/60 | HR 78 | Temp 98.8°F | Ht 69.8 in | Wt 179.9 lb

## 2018-02-02 VITALS — BP 104/60 | HR 78 | Ht 69.0 in | Wt 179.0 lb

## 2018-02-02 DIAGNOSIS — I509 Heart failure, unspecified: Secondary | ICD-10-CM | POA: Insufficient documentation

## 2018-02-02 DIAGNOSIS — K761 Chronic passive congestion of liver: Secondary | ICD-10-CM

## 2018-02-02 DIAGNOSIS — I495 Sick sinus syndrome: Secondary | ICD-10-CM | POA: Insufficient documentation

## 2018-02-02 DIAGNOSIS — Z01818 Encounter for other preprocedural examination: Secondary | ICD-10-CM | POA: Insufficient documentation

## 2018-02-02 DIAGNOSIS — K7689 Other specified diseases of liver: Secondary | ICD-10-CM | POA: Insufficient documentation

## 2018-02-02 DIAGNOSIS — K766 Portal hypertension: Secondary | ICD-10-CM

## 2018-02-02 DIAGNOSIS — I272 Pulmonary hypertension, unspecified: Secondary | ICD-10-CM

## 2018-02-02 DIAGNOSIS — I471 Supraventricular tachycardia: Secondary | ICD-10-CM

## 2018-02-02 DIAGNOSIS — Z87448 Personal history of other diseases of urinary system: Secondary | ICD-10-CM

## 2018-02-02 DIAGNOSIS — Q204 Double inlet ventricle: Secondary | ICD-10-CM

## 2018-02-02 DIAGNOSIS — I498 Other specified cardiac arrhythmias: Secondary | ICD-10-CM | POA: Insufficient documentation

## 2018-02-02 DIAGNOSIS — Z6826 Body mass index (BMI) 26.0-26.9, adult: Secondary | ICD-10-CM

## 2018-02-02 LAB — B_TYPE NATRIURETIC PEPTIDE: B_Type Natriuretic Peptide: 87 pg/mL (ref <101)

## 2018-02-02 LAB — BASIC METABOLIC PANEL
Anion Gap: 5 (ref 4–12)
Calcium: 8.9 mg/dL (ref 8.9–10.2)
Carbon Dioxide, Total: 27 meq/L (ref 22–32)
Chloride: 103 meq/L (ref 98–108)
Creatinine: 1.74 mg/dL — ABNORMAL HIGH (ref 0.51–1.18)
GFR, Calc, African American: 52 mL/min/{1.73_m2} — ABNORMAL LOW (ref >59)
GFR, Calc, European American: 43 mL/min/{1.73_m2} — ABNORMAL LOW (ref >59)
Glucose: 87 mg/dL (ref 62–125)
Potassium: 4.4 meq/L (ref 3.6–5.2)
Sodium: 135 meq/L (ref 135–145)
Urea Nitrogen: 48 mg/dL — ABNORMAL HIGH (ref 8–21)

## 2018-02-02 LAB — CBC (HEMOGRAM)
Hematocrit: 38 % (ref 38–50)
Hemoglobin: 12.1 g/dL — ABNORMAL LOW (ref 13.0–18.0)
MCH: 28.8 pg (ref 27.3–33.6)
MCHC: 31.7 g/dL — ABNORMAL LOW (ref 32.2–36.5)
MCV: 91 fL (ref 81–98)
Platelet Count: 124 10*3/uL — ABNORMAL LOW (ref 150–400)
RBC: 4.2 10*6/uL — ABNORMAL LOW (ref 4.40–5.60)
RDW-CV: 14.7 % — ABNORMAL HIGH (ref 11.6–14.4)
WBC: 6.65 10*3/uL (ref 4.3–10.0)

## 2018-02-02 LAB — HEPATIC FUNCTION PANEL
ALT (GPT): 9 U/L — ABNORMAL LOW (ref 10–64)
AST (GOT): 12 U/L (ref 9–38)
Albumin: 3.8 g/dL (ref 3.5–5.2)
Alkaline Phosphatase (Total): 138 U/L — ABNORMAL HIGH (ref 36–122)
Bilirubin (Direct): 0.2 mg/dL (ref 0.0–0.3)
Bilirubin (Total): 0.6 mg/dL (ref 0.2–1.3)
Protein (Total): 6.5 g/dL (ref 6.0–8.2)

## 2018-02-02 LAB — PROTHROMBIN & PTT
Partial Thromboplastin Time: 67 s — ABNORMAL HIGH (ref 22–35)
Prothrombin INR: 3.3 — ABNORMAL HIGH (ref 0.8–1.3)
Prothrombin Time Patient: 34.4 s — ABNORMAL HIGH (ref 10.7–15.6)

## 2018-02-02 LAB — CREATININE BY I_STAT (POC), ~~LOC~~: Creatinine (POC): 1.8 mg/dL — ABNORMAL HIGH (ref 0.51–1.18)
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
## 2018-02-02 NOTE — Progress Notes (Signed)
See [place]

## 2018-02-02 NOTE — Progress Notes (Signed)
The medications were reviewed by the following patient provided personal medication list.

## 2018-02-02 NOTE — Patient Instructions (Signed)
Ask your doctor about switching from spironolactone to eplerenone for gynecomastia (male breast swelling)

## 2018-02-02 NOTE — Progress Notes (Signed)
This is a shared visit with  Antionette Poles.Blue ARNP.     The patient is referred here by [facility] for evaluation for heart transplantation     The patient's major symptom is increased shortness of breath, decreased exercise tolerance, increased fatigue, congestive heart failure_    This was a face-to-face encounter between myself and the patient, with support from the advanced practice practitioner and nursing staff. I met with the patient, their family if present, reviewed all imaging studies. I verify  Antionette Poles. Blue ARNPs exam and agree with it. We coordinated care and designed a detailed treatment plan.     The followings: Pt with complex si ngle ventricle physiology in early phases of evaluation for heart possible heart liver transplantation. Seeing adult congenital, transplanation and liver teams todays. Pt has severe peripheral edema with non-healing ulcer of toe. Does not eye ball well, appears chronically ill., Will will start to fill poit the surgical worksheet for all our single ventricle fontans. I have met the patiemnt with Dr Micah Flesher and we will begin long process of evaluation. EDV

## 2018-02-03 LAB — EKG 12 LEAD
Atrial Rate: 84 {beats}/min
Diagnosis: NORMAL
P Axis: 71 degrees
P-R Interval: 152 ms
Q-T Interval: 404 ms
QRS Duration: 112 ms
QTC Calculation: 477 ms
R Axis: -59 degrees
T Axis: 118 degrees
Ventricular Rate: 84 {beats}/min

## 2018-02-03 LAB — HLA AB SPECIFICITY (SENDOUT)
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
## 2018-02-04 ENCOUNTER — Encounter (HOSPITAL_BASED_OUTPATIENT_CLINIC_OR_DEPARTMENT_OTHER): Payer: Self-pay | Admitting: Cardiovascular Disease
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
## 2018-02-13 ENCOUNTER — Encounter (HOSPITAL_BASED_OUTPATIENT_CLINIC_OR_DEPARTMENT_OTHER): Payer: Self-pay | Admitting: Adult Health

## 2018-02-13 NOTE — Progress Notes (Signed)
Primary  Secondary Comments    Name Lucas Bass, Lucas Bass      MRU [id number]      Primary  Diagnosis DILV (L-TGA) + PS s/p Right classic BT shunt in 1978     Secondary diagnoses s/p Atriopulmonary Fontan 1985 with Transvenous PM s/p Extracardiac Fontan conversion + MAZE in 2008 Dual chamber PM for SSNS    Date of last surgical intervention 2008      Number of prior sternal entries 2      Etilogy of heart failure Failing Fontan Single Ventricle ( LV morphology)     Other Prior surgeries Right classic BT shunt BT 1978 1 y age      AP Fontan 70) Age 21      Extracardiac Fontan conversion (22 mm goretex) + BDG with COX- Maze III ( R and L) + Epicardial dual chamber PM ([place]) 2008      Epicardial dual Chamber pacemaker system 2014     Other Pertinent Comorbidities Declined for Transplant in 2016 for obesity, social reasons.      Transplant Work Up       Date of evaluation by surgeon 02/02/2018      Patient location Outpatient      Anticipated UNOS Status 4      Assist Device Absent      Gender M      Age 42      Weight 79 kg      Height       BMI 24.3      ABO A      CPRA 99% Notes on PRA     Associated Medical Condition Liver cirrhosis with severe splenomegaly.B/L LE edemas with venous ulcers- healed. Only one in right toe not healed. OSA: Not using his CPaP sice 2014 (self decision). Osteoarthritis, s/p Knee replacement. chronic use of narcotics for chronic pain           [place] Anatomy  Normal             Cardiac Anatomy Location Size     Aorta Left  Normal  Aorta to the left of midline- away from sternum    Pulmonary Artery Center Large MPA transected and ligated    Superior Vena Cava Lucas Bass Present      Left SVC No      Inferior Vena Cava Normal Normal Extracardiac Fontan 22 mm with ASD and VSD IVC flow predominantly directed to LPA and SVC to RPA.   Peripheral Artery/Venous Anatomy Right Left NOTE    Femoral Arteries       Femoral Veins       Innominate Arteries   Right Classic BT shunt-  Not to place arterial line on RUE   Innominate Veins Patent  Space between sternum and innomonate vein    Jugular Veins Patent      Veno-Veno Collaterals Patent Absent     Aorto-Pulmonary Collaterals Absent Absent     AICD/Pacemaker Wires Present  Pacer box in right shoulder area           [place] Recommendations option1 option 2 option 3 NOTE          CT/MRI Review    Liver CXT consistent with cirrhosis   Hepatology Review Pending   Bx in 2016: Congestive Hepatopathy   Hepatology recommendaion  Heart/Liver      Coagulation Concerns Major      ACC Medications N/A N/A N/A  Hemodynamic Data (last set) Cath 11/07/2014             Fontan Pressure 23 mmHg      LVEDP       Systemic Saturation ( RA)  85%      Intracardiac Shunts Qp/Qs=1:1      Tranhepatic Gradient 4 mmhg      CPET (10/15/2014) PVO2: 15.9 ml/kg/min 37% pred. AT at 86% pred. O2 pulse of 12.7 ( 70% pred)     Systemic ventricle EF       PVR  1.6 WU      TPG 5 mmHg      Labs Date 02/01/2018             Hct 38%      Platelet 124      INR 3.3      PT 34.4      PTT -      Albumin 3.8      Abnormal LFTs Alk Phos: 138      Bun 48      Cr 1.74      Glucose 87      HgA1C -             Transfusion Recommendation Unusual Very high PRA's            Renal Review Pending      Renal Recommendaion              Donor/Recipient       Donor needs Extra Pulmonary Artery and both branches to hilia. Bring SVC and innominate vein. Extra Aorta Donor for Heart Only ( No Lungs)    Recipient needs              Overall Assessed Risk High             Precuement Needs       Precurement Distance limitation None      Time limitation ( Day/Night) Prefer Day      Weekend limitaitons None             Specific [place] Recommendations  Need to be daytime implantation. No Lungs to be procured on this donor for extra tissue Needs. Local donor or total ischemic times < 3 hours.                                Summary  / Comments       Fairly high risk. LV anterior wall is quite close to  sternum, but innominate vein and aorta are far.                                             This document has been reviewed by Drs. Verrier and Gencini for heart transplant surgery work-up.

## 2018-02-14 ENCOUNTER — Inpatient Hospital Stay: Payer: Self-pay
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
## 2018-11-11 ENCOUNTER — Inpatient Hospital Stay: Payer: Self-pay

## 2019-01-22 ENCOUNTER — Inpatient Hospital Stay: Payer: Self-pay

## 2019-07-16 IMAGING — CT CT MAXILLOFACIAL W CONTRAST
3 series · 16 of 47 positions shown, 19 images · IV contrast (agent unspecified)
Comparison: None

REASON FOR EXAM: Mandible abscess
TECHNIQUE: Multiple helical CT images of the maxillofacial tissues were acquired after the administration of IV contrast.  Multiplanar reformatted images were also generated. Total radiation dose to patient is CTDIvol 17.15 mGy and DLP 348.92 mGy-cm.

IV Contrast: 100 mL 3sovue-VGG

[Series 3: soft supine post · axial · 0.35mm/px · z∈[-181,-17]mm · 10 of 96 slices shown, 13 images]
[im 7/96  brain]
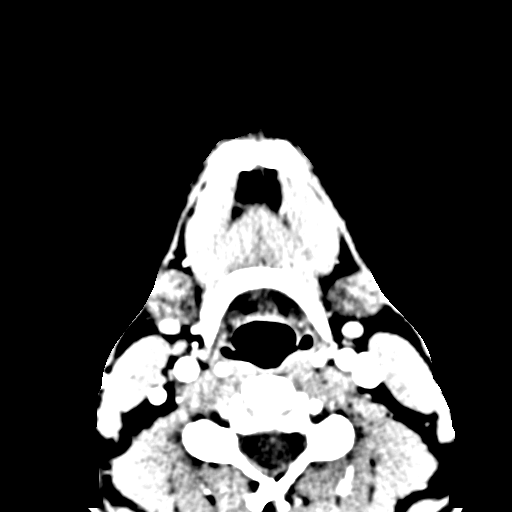
[im 7/96  bone]
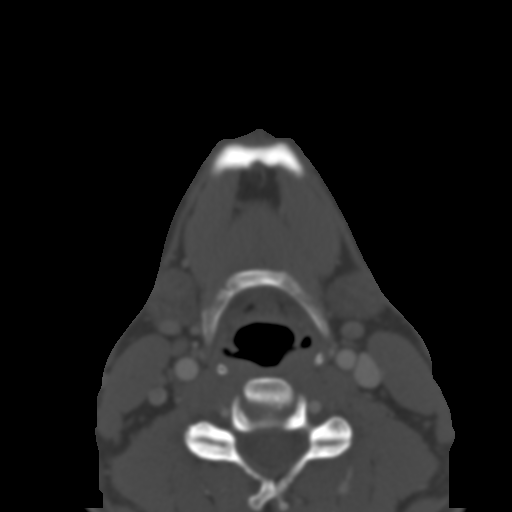
[im 17/96  bone]
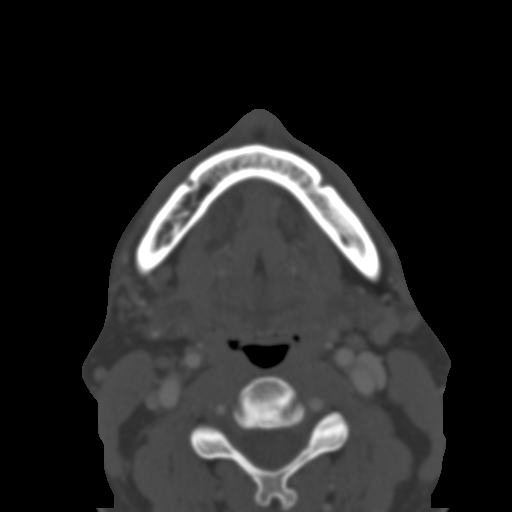
[im 27/96  bone]
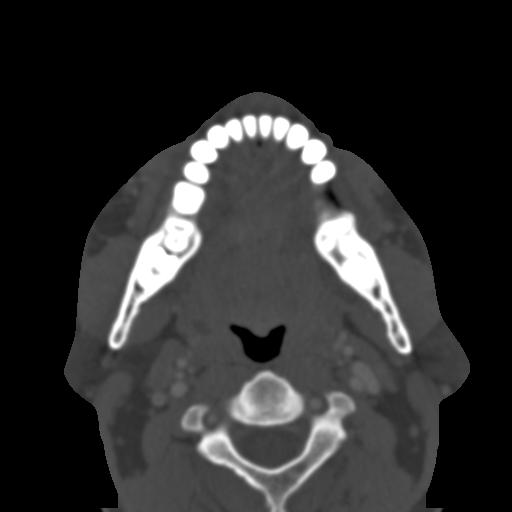
[im 33/96  bone]
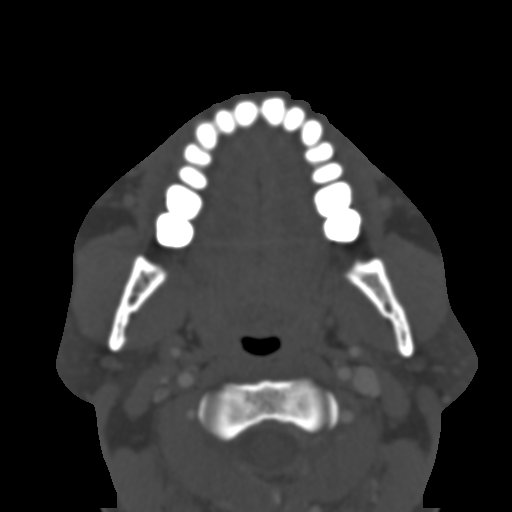
[im 43/96  brain]
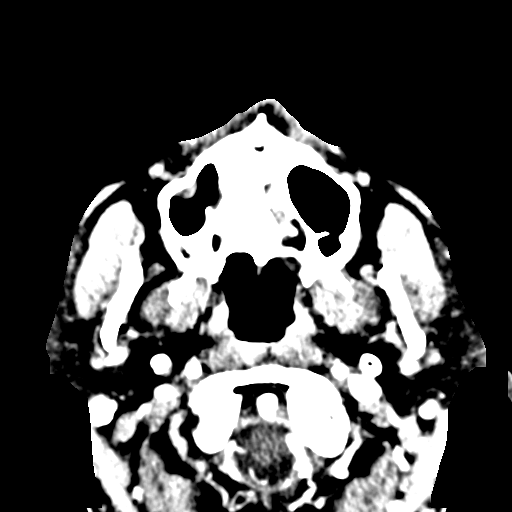
[im 43/96  bone]
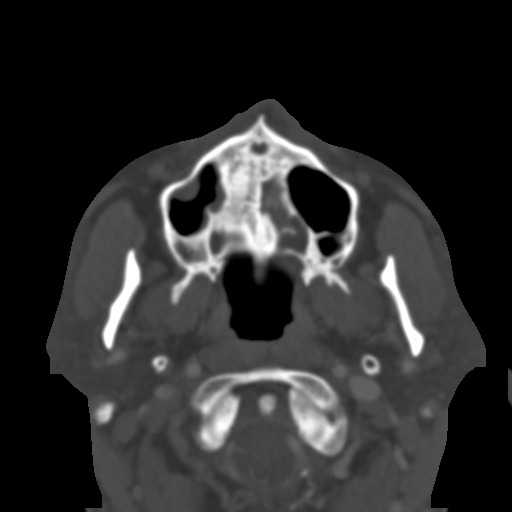
[im 53/96  bone]
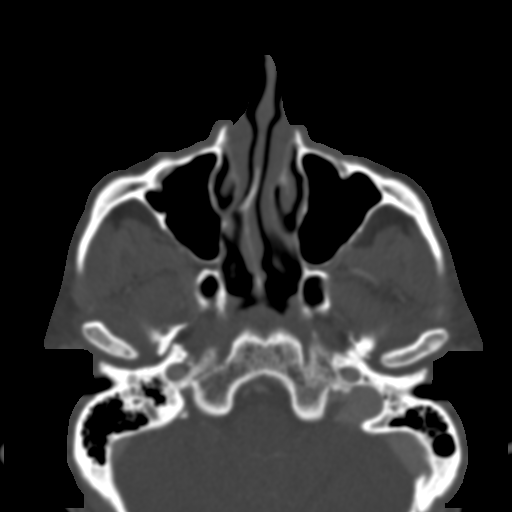
[im 63/96  bone]
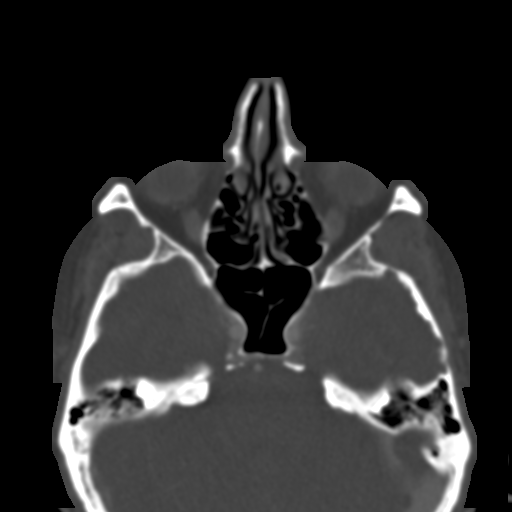
[im 73/96  bone]
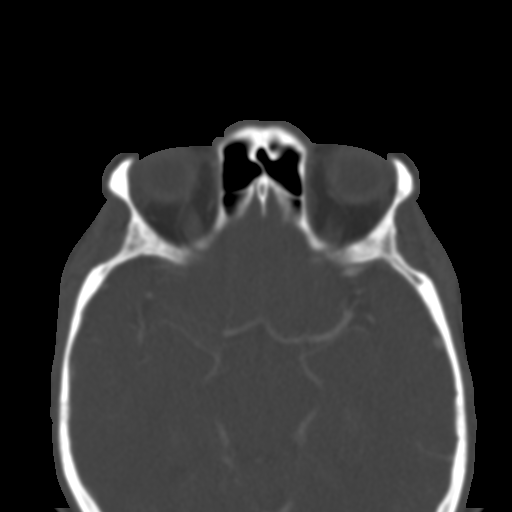
[im 79/96  brain]
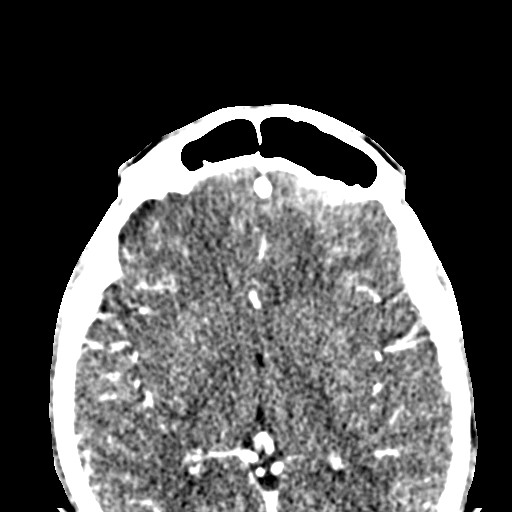
[im 79/96  bone]
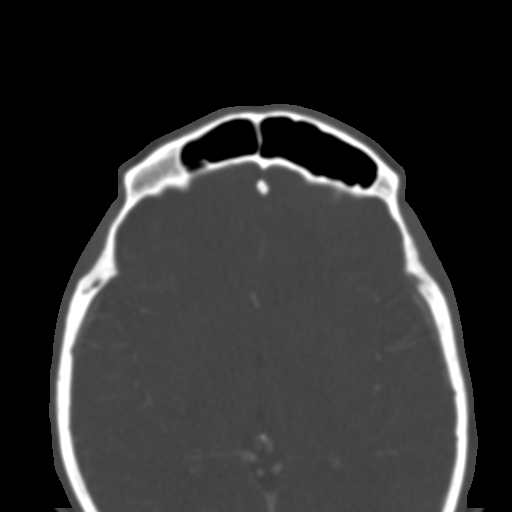
[im 89/96  bone]
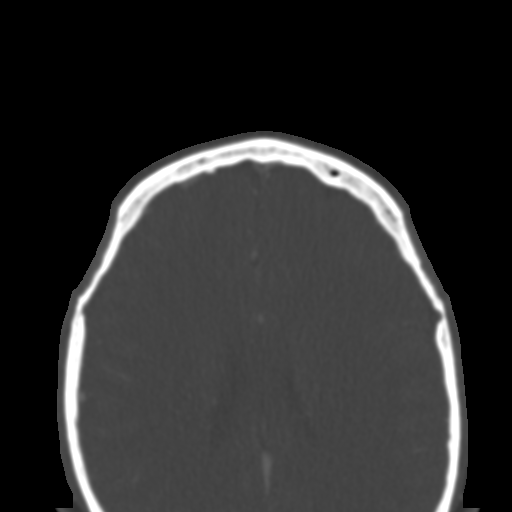

[Series 4: coronal soft tissue · coronal · 0.34mm/px · 3 of 84 slices shown]
[im 37/84  bone]
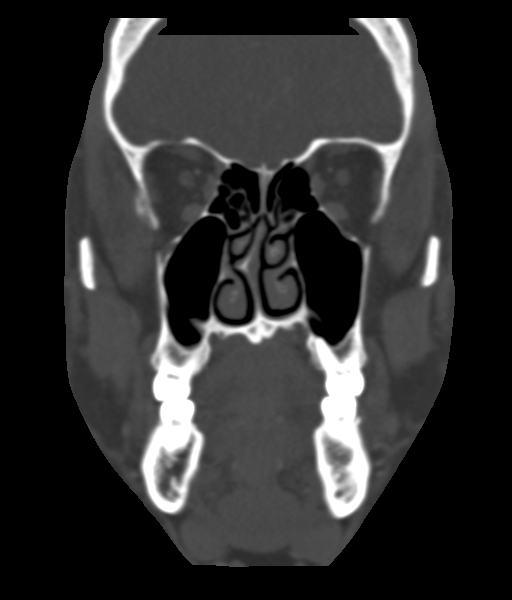
[im 47/84  bone]
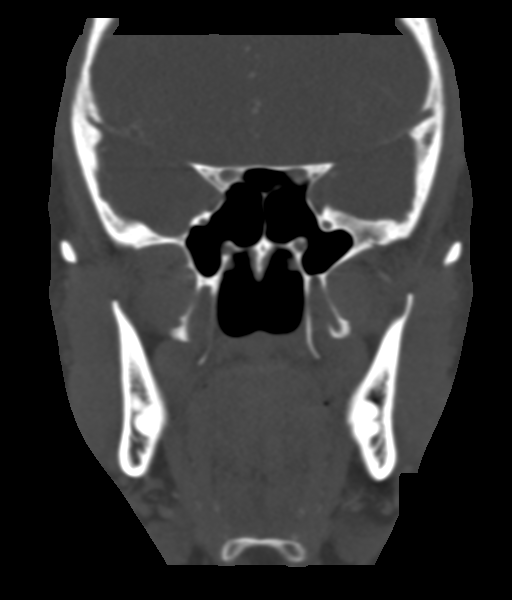
[im 56/84  bone]
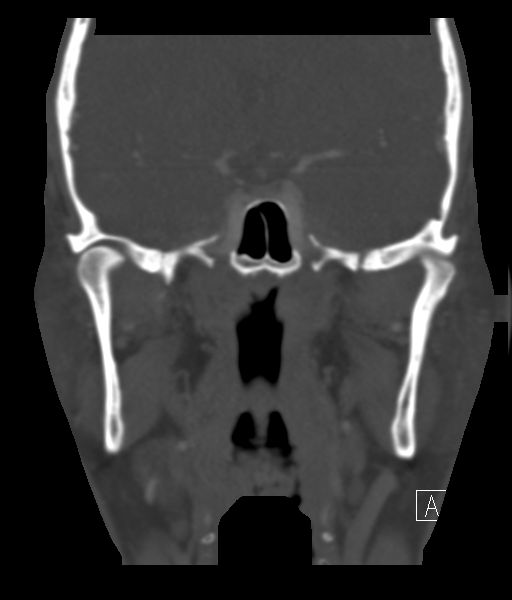

[Series 5: sagittal soft tissue · sagittal · 0.37mm/px · 3 of 81 slices shown]
[im 27/81  bone]
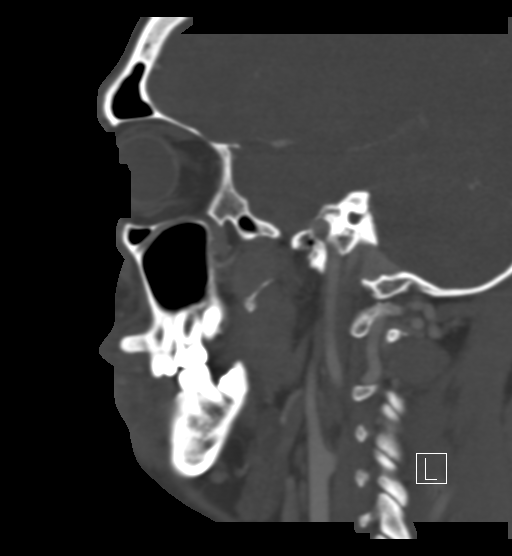
[im 41/81  bone]
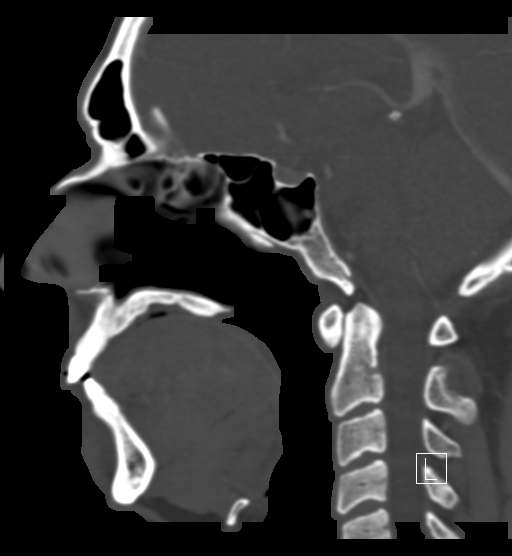
[im 54/81  bone]
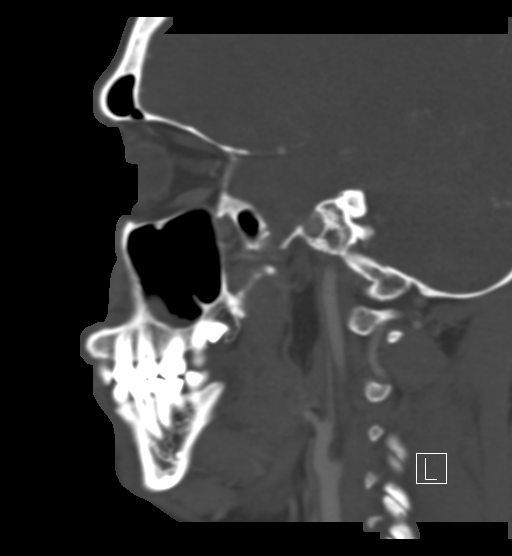

[16 of 47 positions shown; findings below may reference images not displayed]

FINDINGS: ANTERIOR SINUSES: The frontal sinus and anterior ethmoid air cells are well aerated.  Mild mucosal thickening in the right maxillary sinus. The left maxillary sinus is well aerated. Both ostiomeatal units are patent.

POSTERIOR SINUSES: The posterior ethmoid air cells and sphenoid sinus are well aerated.

NASAL VAULT: No exuberant mucosal thickening of the nasal septum or turbinates.

OTOMASTOID: The mastoid air cells and middle ears are well aerated.

TMJ: Unremarkable

OTHER: A 0.7 cm torus arises from the buccal mandible near the roots of tooth #20. There are also several tori about the maxilla. No suspicious periapical lucency about any of the teeth. No gingival abscess evident on this exam.
IMPRESSION: No periapical lucency or gingival abscess evident on this exam.

The palpable finding is likely a mandibular torus.

## 2019-08-30 ENCOUNTER — Inpatient Hospital Stay: Payer: Self-pay

## 2019-12-07 ENCOUNTER — Encounter (HOSPITAL_COMMUNITY): Payer: Self-pay

## 2019-12-08 NOTE — Historical Transplant Communications (Signed)
Lucas, Bass [phone])  Transplant Communications  ____________________________________________________________________________________    Date: 01/03/2018  Type: Action  Entry By: Ellie Lunch to patient re referral to see Ashley Jacobs per Hepatology. Pt will be in [place] for [facility] appts on 02/02/18. Dr. Ashley Jacobs has agreed to see him same day. Appt scheduled for 11am. Reminder sent.

## 2019-12-08 NOTE — Historical Transplant Studies (Signed)
Lucas Bass, [name] [phone])  Heart Pre-Transplant Studies  Referral Date: 10/07/2014  Status: In Workup  ____________________________________________________________________________________    Primary Diagnosis: Congenital Heart Defect - With Surgery   ABO: A POS    Social History:     Family History:     Education/Employment History:       Previous Blood Transfusions:     Past Medical History:     Prior Cardiac Surgery:     Medication History:     Post-op Antibiotic Regiment:     Alcohol History:     Tobacco History: Smoked? No  Chewed? No    Recreational Drug History:     UAs:  A - 25, 26, 66  DR - 01, 04, 09, 11, DR51    WUAs:  DPB1 - 02:01, 04:02, 18:01, 28:01  DQB1 - 07  DR - 01:09, 07, 08, 10, 14, DR53

## 2019-12-26 ENCOUNTER — Emergency Department: Payer: Self-pay

## 2019-12-28 ENCOUNTER — Emergency Department: Payer: Self-pay

## 2019-12-28 ENCOUNTER — Inpatient Hospital Stay: Payer: Self-pay

## 2020-02-06 ENCOUNTER — Emergency Department: Payer: Self-pay

## 2020-02-06 ENCOUNTER — Inpatient Hospital Stay: Payer: Self-pay

## 2023-03-23 IMAGING — CR ABDOMEN  KUB ONLY
1 series · 2 of 2 positions shown · non-contrast
Comparison: None
TECHNIQUE AND FINDINGS:
A supine abdomen shows a nonobstructive bowel gas pattern.

Images Obtained from [facility]
REASON FOR EXAM: Kidney stones

[Series 2: AP · 0.17mm/px · 2 of 2 slices shown]
[im 1/2]
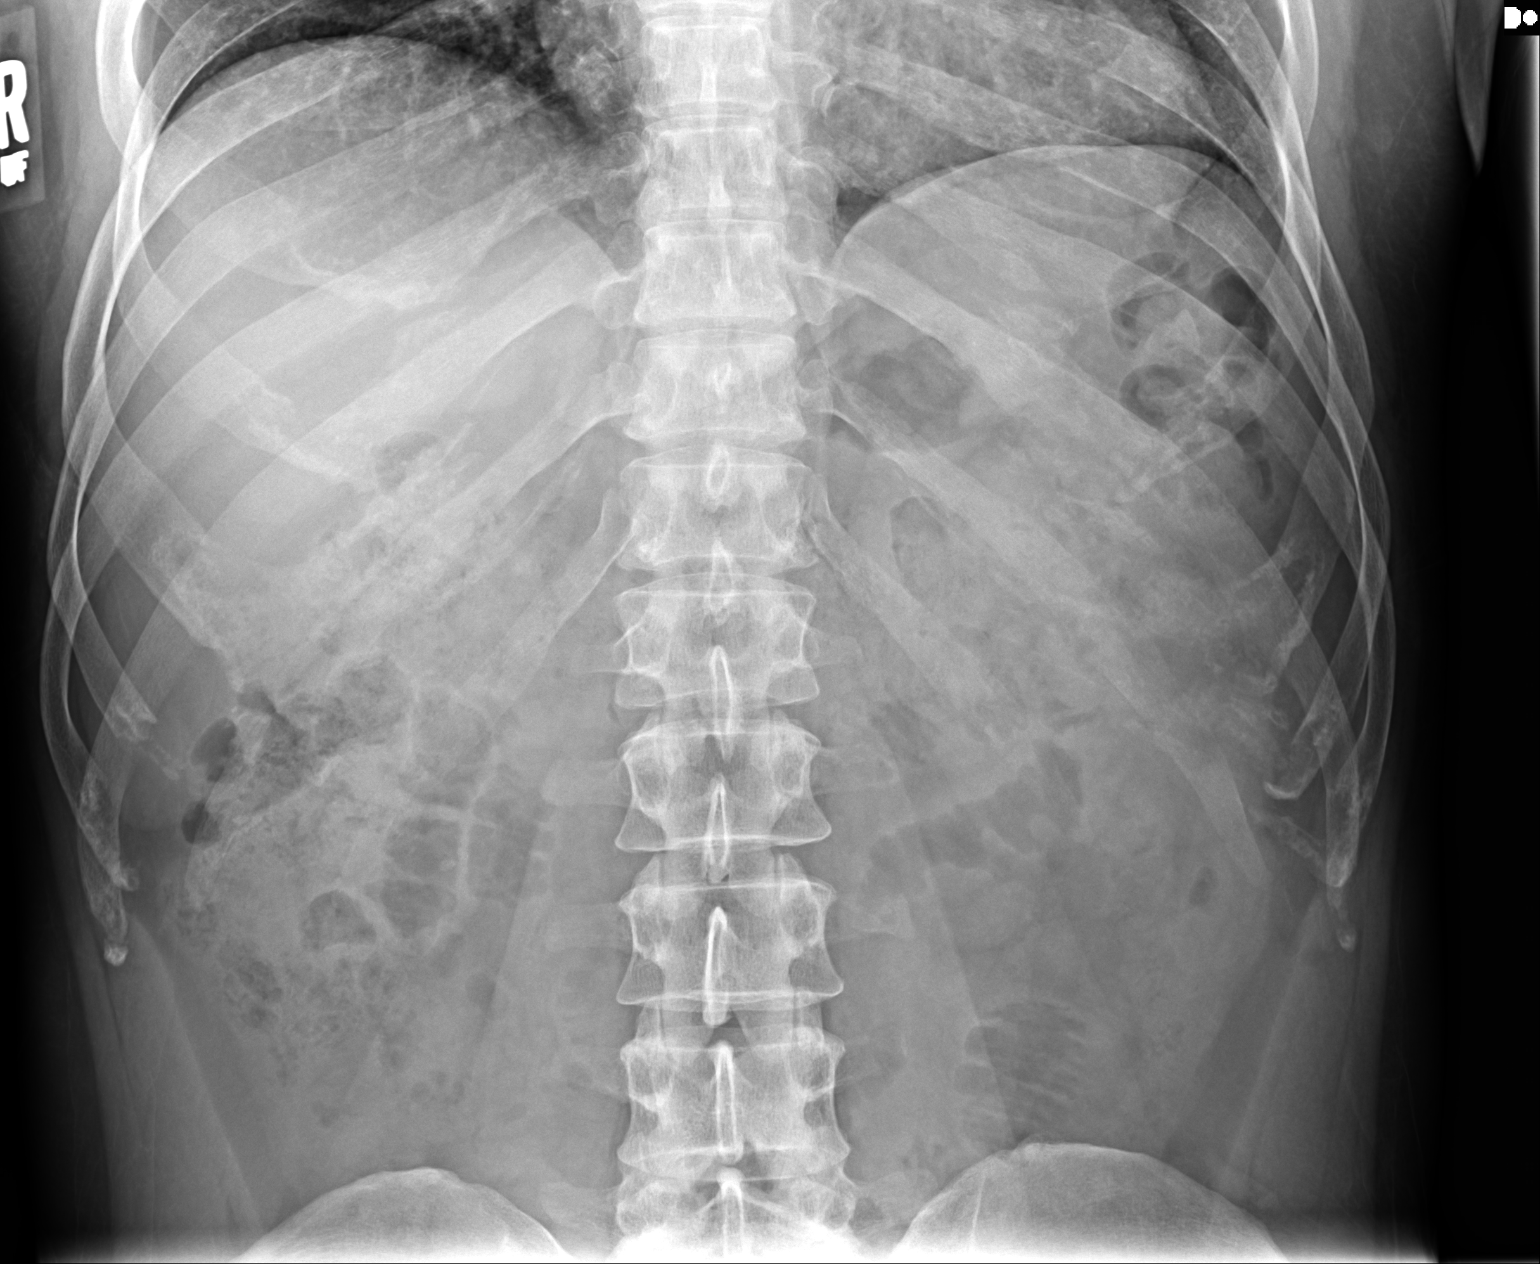
[im 2/2]
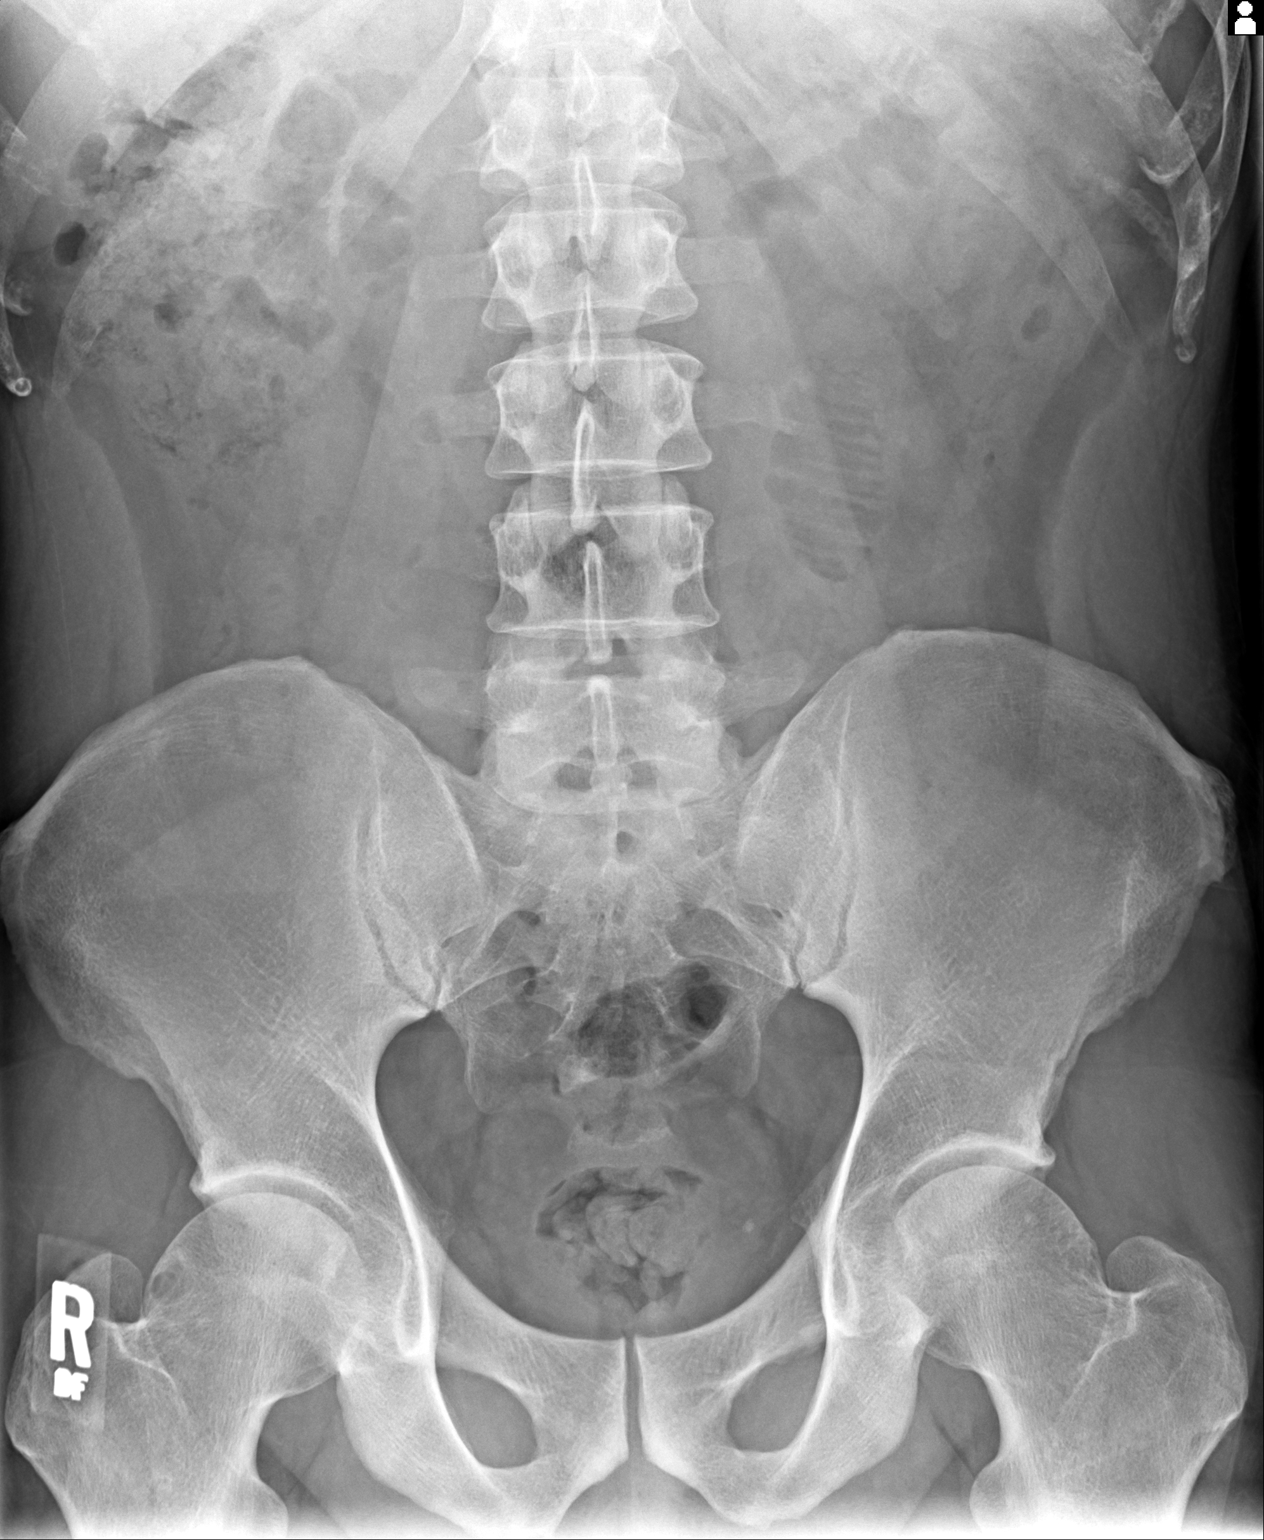

[2 of 2 positions shown; findings below may reference images not displayed]

No extraluminal gas identified. No suspicious calcifications are seen. The bones are unremarkable.
IMPRESSION: No radiopaque genitourinary calculus is identified.

## 2023-03-23 IMAGING — MR MRI LSPINE WO CONTRAST
5 of 6 series · 31 of 48 positions shown · non-contrast
Comparison: CT abdomen and pelvis studies dated 01/17/2023.

Images Obtained from [facility]
HISTORY: 46 years-old Male with Low back pain, unspecified, Radiculopathy, lumbar region.
TECHNIQUE: Multiplanar multisequential MR images were obtained of the lumbar spine without contrast administration.

[Series 16: t2_sag · sagittal · 4.0mm · 0.73mm/px · 4 of 16 slices shown]
[im 1/16]
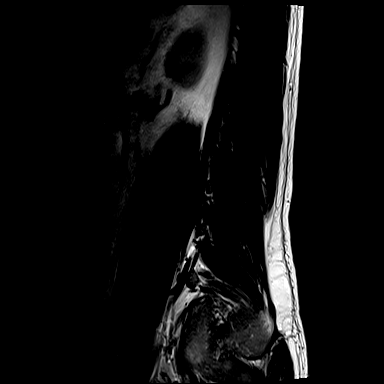
[im 6/16]
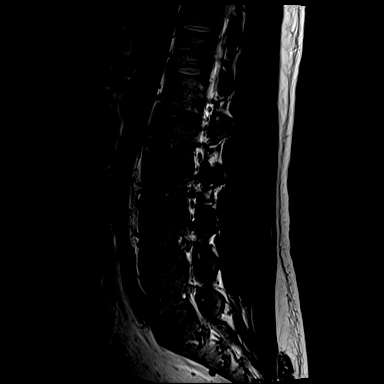
[im 11/16]
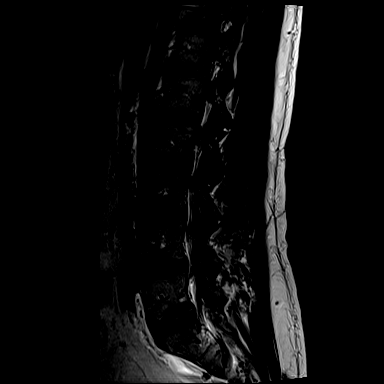
[im 16/16]
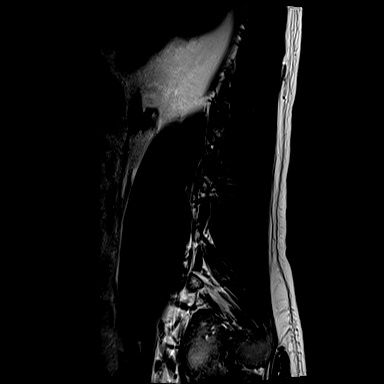

[Series 17: t1_sag · sagittal · 4.0mm · 0.88mm/px · 4 of 16 slices shown]
[im 1/16]
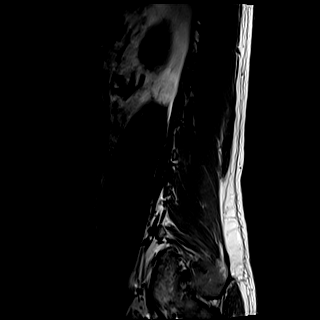
[im 6/16]
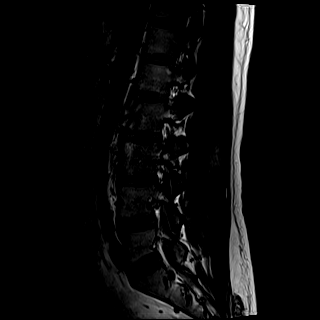
[im 11/16]
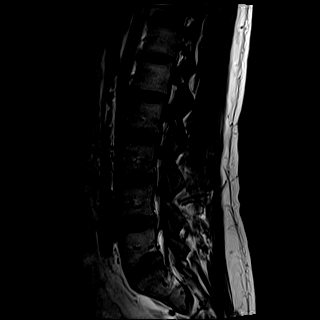
[im 16/16]
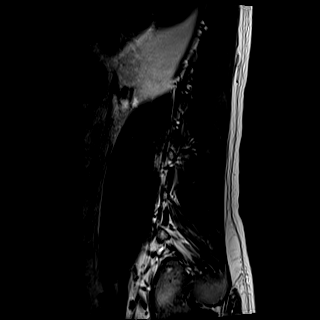

[Series 18: stir_sag · sagittal · 4.0mm · 1.09mm/px · 4 of 16 slices shown]
[im 1/16]
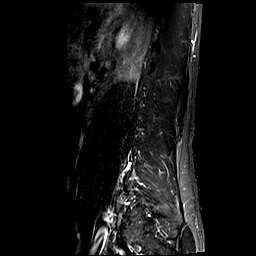
[im 6/16]
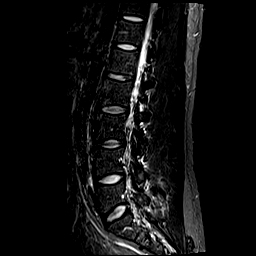
[im 11/16]
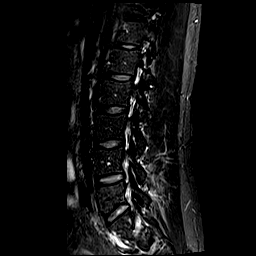
[im 16/16]
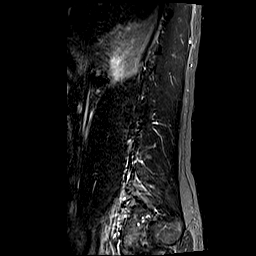

[Series 19: t2_axial · axial · 4.0mm · 0.62mm/px · z∈[-436,-231]mm · 12 of 42 slices shown]
[im 1/42]
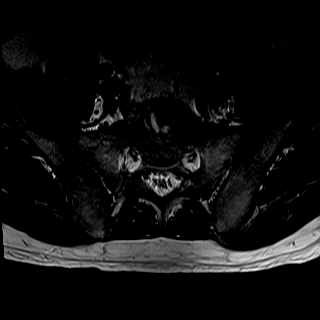
[im 4/42]
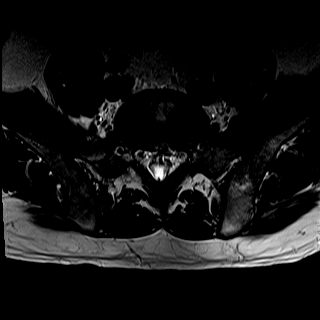
[im 8/42]
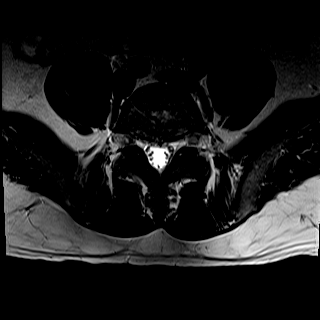
[im 12/42]
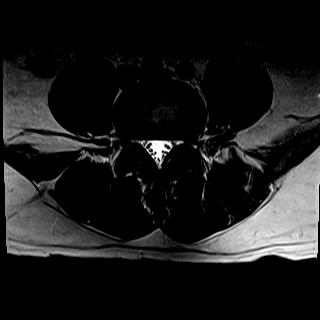
[im 15/42]
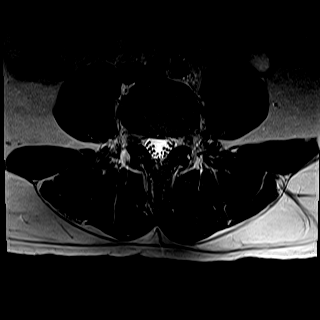
[im 19/42]
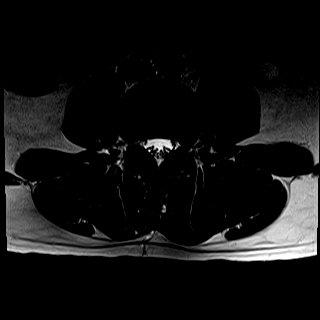
[im 23/42]
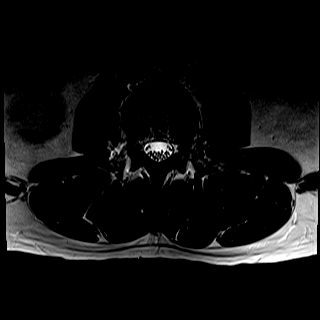
[im 27/42]
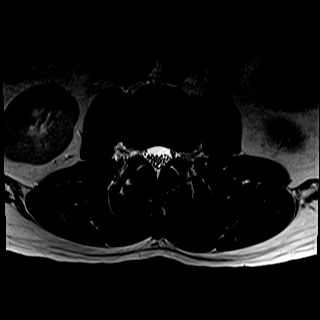
[im 30/42]
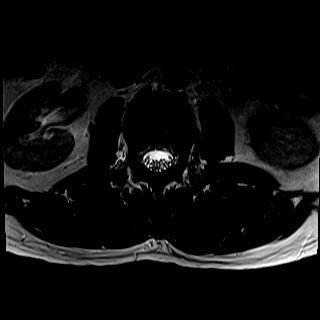
[im 34/42]
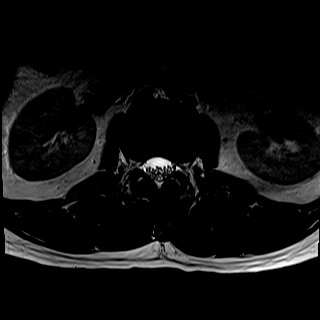
[im 38/42]
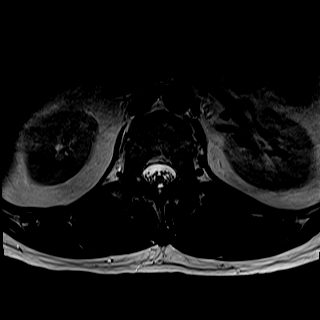
[im 42/42]
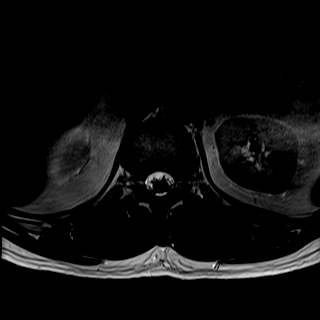

[Series 20: t1_axial_obli · axial · 3.0mm · 0.78mm/px · z∈[-461,-239]mm · 7 of 27 slices shown]
[im 1/27]
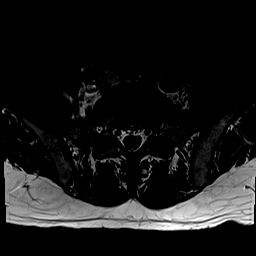
[im 5/27]
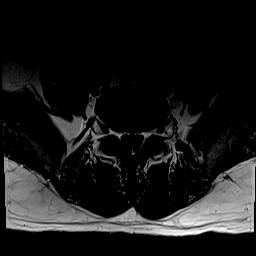
[im 9/27]
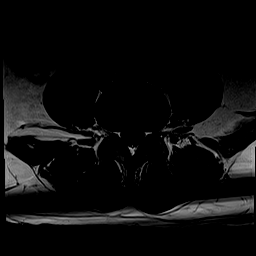
[im 14/27]
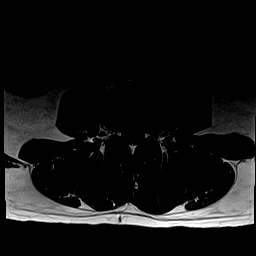
[im 18/27]
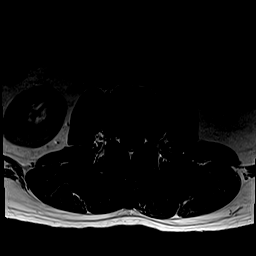
[im 22/27]
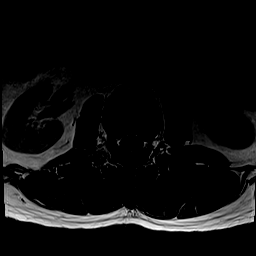
[im 27/27]
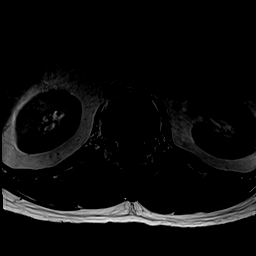

[31 of 48 positions shown; findings below may reference images not displayed]

FINDINGS: COUNT/LABELING: Please note that spinal labeling was performed from C2 downwards from scout images.
Vertebral body height and alignment: Within normal limits.
Degenerative disc changes: No significant degenerative disc disease is present.
Conus and cauda equina: Within normal limits.
Marrow signal: The marrow signal is unremarkable.
L5-S1: No disc protrusion, canal stenosis, or foraminal stenosis.
L4-L5: No disc protrusion, canal stenosis, or foraminal stenosis.
L3-L4: No disc protrusion, canal stenosis, or foraminal stenosis.
L2-L3: No disc protrusion, canal stenosis, or foraminal stenosis.
L1-L2: No disc protrusion, canal stenosis, or foraminal stenosis.
T12-L1: No disc protrusion, canal stenosis, or foraminal stenosis.
Visualized SI joints: Intact.
Visualized soft tissues: Unremarkable
IMPRESSION: No MRI evidence of severe lumbar spinal canal stenosis, disc herniation or neural foraminal impingement.

## 9063-12-13 DEATH — deceased
